# Patient Record
Sex: Male | Born: 1935 | Race: White | Hispanic: No | Marital: Married | Smoking: Former smoker
Health system: Southern US, Community
[De-identification: ages and names within clinical notes are randomized; demographics above are authoritative.]

## PROBLEM LIST (undated history)

## (undated) DIAGNOSIS — Z87442 Personal history of urinary calculi: Secondary | ICD-10-CM

## (undated) DIAGNOSIS — I10 Essential (primary) hypertension: Secondary | ICD-10-CM

## (undated) DIAGNOSIS — E785 Hyperlipidemia, unspecified: Secondary | ICD-10-CM

## (undated) DIAGNOSIS — M109 Gout, unspecified: Secondary | ICD-10-CM

## (undated) DIAGNOSIS — M1990 Unspecified osteoarthritis, unspecified site: Secondary | ICD-10-CM

## (undated) DIAGNOSIS — E119 Type 2 diabetes mellitus without complications: Secondary | ICD-10-CM

## (undated) DIAGNOSIS — Q600 Renal agenesis, unilateral: Secondary | ICD-10-CM

## (undated) DIAGNOSIS — N189 Chronic kidney disease, unspecified: Secondary | ICD-10-CM

## (undated) HISTORY — PX: OTHER SURGICAL HISTORY: SHX169

---

## 2006-05-17 HISTORY — PX: OTHER SURGICAL HISTORY: SHX169

## 2013-02-13 ENCOUNTER — Other Ambulatory Visit: Payer: Self-pay | Admitting: Orthopedic Surgery

## 2013-02-22 ENCOUNTER — Other Ambulatory Visit: Payer: Self-pay | Admitting: Orthopedic Surgery

## 2013-02-22 ENCOUNTER — Encounter (HOSPITAL_COMMUNITY): Payer: Self-pay | Admitting: Pharmacy Technician

## 2013-02-26 ENCOUNTER — Other Ambulatory Visit (HOSPITAL_COMMUNITY): Payer: Self-pay | Admitting: *Deleted

## 2013-02-27 ENCOUNTER — Encounter (HOSPITAL_COMMUNITY)
Admission: RE | Admit: 2013-02-27 | Discharge: 2013-02-27 | Disposition: A | Discharge status: Home / Self Care | Payer: Medicare Other | Source: Ambulatory Visit | Attending: Orthopedic Surgery | Admitting: Orthopedic Surgery

## 2013-02-27 ENCOUNTER — Encounter (HOSPITAL_COMMUNITY): Payer: Self-pay

## 2013-02-27 ENCOUNTER — Ambulatory Visit (HOSPITAL_COMMUNITY)
Admission: RE | Admit: 2013-02-27 | Discharge: 2013-02-27 | Disposition: A | Discharge status: Home / Self Care | Payer: Medicare Other | Source: Ambulatory Visit | Attending: Orthopedic Surgery | Admitting: Orthopedic Surgery

## 2013-02-27 DIAGNOSIS — M5380 Other specified dorsopathies, site unspecified: Secondary | ICD-10-CM | POA: Insufficient documentation

## 2013-02-27 DIAGNOSIS — Z01812 Encounter for preprocedural laboratory examination: Secondary | ICD-10-CM | POA: Insufficient documentation

## 2013-02-27 DIAGNOSIS — Z01818 Encounter for other preprocedural examination: Secondary | ICD-10-CM | POA: Insufficient documentation

## 2013-02-27 DIAGNOSIS — M1710 Unilateral primary osteoarthritis, unspecified knee: Secondary | ICD-10-CM | POA: Insufficient documentation

## 2013-02-27 DIAGNOSIS — I10 Essential (primary) hypertension: Secondary | ICD-10-CM | POA: Insufficient documentation

## 2013-02-27 HISTORY — DX: Chronic kidney disease, unspecified: N18.9

## 2013-02-27 HISTORY — DX: Gout, unspecified: M10.9

## 2013-02-27 HISTORY — DX: Type 2 diabetes mellitus without complications: E11.9

## 2013-02-27 HISTORY — DX: Essential (primary) hypertension: I10

## 2013-02-27 LAB — CBC
Hemoglobin: 16.2 g/dL (ref 13.0–17.0)
MCH: 32.3 pg (ref 26.0–34.0)
Platelets: 113 10*3/uL — ABNORMAL LOW (ref 150–400)
RBC: 5.02 MIL/uL (ref 4.22–5.81)
WBC: 6.6 10*3/uL (ref 4.0–10.5)

## 2013-02-27 LAB — COMPREHENSIVE METABOLIC PANEL
ALT: 21 U/L (ref 0–53)
AST: 24 U/L (ref 0–37)
Albumin: 3.9 g/dL (ref 3.5–5.2)
Alkaline Phosphatase: 71 U/L (ref 39–117)
CO2: 28 mEq/L (ref 19–32)
Calcium: 10.1 mg/dL (ref 8.4–10.5)
GFR calc Af Amer: 64 mL/min — ABNORMAL LOW (ref >90)
GFR calc non Af Amer: 55 mL/min — ABNORMAL LOW (ref >90)
Glucose, Bld: 106 mg/dL — ABNORMAL HIGH (ref 70–99)
Potassium: 4.4 mEq/L (ref 3.5–5.1)
Sodium: 138 mEq/L (ref 135–145)

## 2013-02-27 LAB — URINALYSIS, ROUTINE W REFLEX MICROSCOPIC
Glucose, UA: NEGATIVE mg/dL
Ketones, ur: NEGATIVE mg/dL
Nitrite: NEGATIVE
Protein, ur: NEGATIVE mg/dL
Specific Gravity, Urine: 1.016 (ref 1.005–1.030)
Urobilinogen, UA: 0.2 mg/dL (ref 0.0–1.0)

## 2013-02-27 LAB — SURGICAL PCR SCREEN
MRSA, PCR: NEGATIVE
Staphylococcus aureus: NEGATIVE

## 2013-02-27 LAB — PROTIME-INR: INR: 0.96 (ref 0.00–1.49)

## 2013-02-27 LAB — ABO/RH: ABO/RH(D): O POS

## 2013-02-27 LAB — APTT: aPTT: 30 seconds (ref 24–37)

## 2013-02-27 NOTE — Progress Notes (Signed)
Cbc and ua, micro results faxed to dr Lequita Halt by epic

## 2013-02-27 NOTE — Patient Instructions (Addendum)
[facility]  02/27/2013   Your procedure is scheduled on: 03-05-2013  Report to [facility] at 625 AM.  Call this number if you have problems the morning of surgery [phone]   Remember:   Do not eat food or drink liquids :After Midnight.     Take these medicines the morning of surgery with A SIP OF WATER: no meds to take                                SEE [facility] PREPARING FOR SURGERY SHEET             You may not have any metal on your body including hair pins and piercings  Do not wear jewelry, make-up.  Do not wear lotions, powders, or perfumes. You may wear deodorant.   Men may shave face and neck.  Do not bring valuables to the hospital. [facility] IS NOT RESPONSIBLE FOR VALUEABLES.  Contacts, dentures or bridgework may not be worn into surgery.  Leave suitcase in the car. After surgery it may be brought to your room.  For patients admitted to the hospital, checkout time is 11:00 AM the day of discharge.   Patients discharged the day of surgery will not be allowed to drive home.  Name and phone number of your driver:  Special Instructions: N/A   Please read over the following fact sheets that you were given: mrsa information, blood fact sheet, incentive spirometer sheet  Call Cain Sieve RN pre op nurse if needed [phone]    FAILURE TO FOLLOW THESE INSTRUCTIONS MAY RESULT IN THE CANCELLATION OF YOUR SURGERY.  PATIENT SIGNATURE___________________________________________  NURSE SIGNATURE_____________________________________________

## 2013-02-28 NOTE — Progress Notes (Signed)
Fax received and placed on pt chart cipro 500 mg bid x 3 days called in for pt by drew perkins pa, and aware pt platlet count 113.

## 2013-03-04 ENCOUNTER — Other Ambulatory Visit: Payer: Self-pay | Admitting: Orthopedic Surgery

## 2013-03-04 NOTE — H&P (Signed)
[facility]  DOB: 11/28/1933 Married / Language: English / Race: White Male  Date of Admission:  03/05/2013  Chief Complaint:  Left Knee Pain  History of Present Illness The patient is a 78 year old male who comes in for a preoperative History and Physical. The patient is scheduled for a left total knee arthroplasty to be performed by Dr. Frank V. Aluisio, MD at [facility] on 03/05/2013. The patient is a 78 year old male who presents today for follow up of their knee. The patient is being followed for their bilateral knee pain and osteoarthritis. They are now 6 month(s) out from last evaluation. Symptoms reported today include: pain (especially with stairs in anterior knees), while the patient does not report symptoms of: swelling or instability. Current treatment includes: bracing (custom OA brace on the left). [facility] comes back in today for his knees accompanied by his daughter, [facility]. He states that knees continue to cause problems. He was evaluated six months ago for bilateral knee pain, left wores than right. Based on recommendations, he started water aerobics. He states that one of the biggest issues is going up and down steps. He has been working with a personal trainer and concentrating on strengthening his quad muscles. He has reached a point that he would like to go ahead and pursue knee replacement surgery. They have been treated conservatively in the past for the above stated problem and despite conservative measures, they continue to have progressive pain and severe functional limitations and dysfunction. They have failed non-operative management including home exercise, medications, and injections. It is felt that they would benefit from undergoing total joint replacement. Risks and benefits of the procedure have been discussed with the patient and they elect to proceed with surgery. There are no active contraindications to surgery such as ongoing  infection or rapidly progressive neurological disease.   Problem List Primary osteoarthritis of both knees (715.16)    Allergies Sulfa Drugs. Dizziness. Penicillins    Family History Kidney disease. grandmother fathers side and child Osteoarthritis. mother, sister and brother Father. Dementia    Social History Illicit drug use. no Living situation. live with spouse Marital status. married Drug/Alcohol Rehab (Currently). no Drug/Alcohol Rehab (Previously). no Exercise. Exercises daily; does gym / weights Pain Contract. no Tobacco / smoke exposure. no Tobacco use. former smoker; smoke(d) 1 pack(s) per day Alcohol use. current drinker; drinks beer, wine and hard liquor; only occasionally per week Children. 5 or more Current work status. retired Post-Surgical Plans. Plan is to go to [facility]    Medication History Aspirin (81MG Tablet, 1 (one) Oral) Active. Vitamin D ( Oral) Specific dose unknown - Active. Finasteride (5MG Tablet, Oral) Active. Lisinopril (10MG Tablet, Oral) Active. Simvastatin (10MG Tablet, Oral) Active. Vitamin D ( Oral) Specific dose unknown - Active. Vitamin B12 ( Oral) Specific dose unknown - Active.    Past Surgical History Arthroscopy of Knee. right Cataract Surgery. bilateral Colon Polyp Removal - Colonoscopy   Medical Histroy Diabetes Mellitus, Type II Gout High blood pressure Kidney Stone Shingles Cataract Varicose veins Hiatal Hernia Hemorrhoids Scarlet Fever Measles Mumps Solitary Kidney    Review of Systems General:Present- Night Sweats. Not Present- Chills, Fever, Fatigue, Weight Gain, Weight Loss and Memory Loss. Skin:Not Present- Hives, Itching, Rash, Eczema and Lesions. HEENT:Not Present- Tinnitus, Headache, Double Vision, Visual Loss, Hearing Loss and Dentures. Respiratory:Not Present- Shortness of breath with exertion, Shortness of breath at rest, Allergies, Coughing up  blood and Chronic Cough. Cardiovascular:Not Present-   Chest Pain, Racing/skipping heartbeats, Difficulty Breathing Lying Down, Murmur, Swelling and Palpitations. Gastrointestinal:Not Present- Bloody Stool, Heartburn, Abdominal Pain, Vomiting, Nausea, Constipation, Diarrhea, Difficulty Swallowing, Jaundice and Loss of appetitie. Male Genitourinary:Not Present- Urinary frequency, Blood in Urine, Weak urinary stream, Discharge, Flank Pain, Incontinence, Painful Urination, Urgency, Urinary Retention and Urinating at Night. Musculoskeletal:Present- Joint Pain. Not Present- Muscle Weakness, Muscle Pain, Joint Swelling, Back Pain, Morning Stiffness and Spasms. Neurological:Not Present- Tremor, Dizziness, Blackout spells, Paralysis, Difficulty with balance and Weakness. Psychiatric:Not Present- Insomnia.    Vitals Weight: 184 lb Height: 68 in Height was reported by patient. Body Surface Area: 2 m Body Mass Index: 27.98 kg/m Pulse: 64 (Regular) Resp.: 14 (Unlabored) BP: 128/70 (Sitting, Left Arm, Standard)     Physical Exam The physical exam findings are as follows:   General Mental Status - Alert, cooperative and good historian. General Appearance- pleasant. Not in acute distress. Orientation- Oriented X3. Build & Nutrition- Well nourished and Well developed.   Head and Neck Head- normocephalic, atraumatic . Neck Global Assessment- supple. no bruit auscultated on the right and no bruit auscultated on the left.   Eye Vision- Wears corrective lenses. Pupil- Bilateral- Regular and Round. Motion- Bilateral- EOMI.   Chest and Lung Exam Auscultation: Breath sounds:- clear at anterior chest wall and - clear at posterior chest wall. Adventitious sounds:- No Adventitious sounds.   Cardiovascular Auscultation:Rhythm- Regular rate and rhythm. Heart Sounds- S1 WNL and S2 WNL. Murmurs & Other Heart Sounds:Auscultation of the heart reveals - No  Murmurs.   Abdomen Palpation/Percussion:Tenderness- Abdomen is non-tender to palpation. Rigidity (guarding)- Abdomen is soft. Auscultation:Auscultation of the abdomen reveals - Bowel sounds normal.   Male Genitourinary Not done, not pertinent to present illness  Musculoskeletal  Well developed male alert and oriented in no apparent distress. The left knee no effusion. Range of motion is 5 to 125. He has a varus deformity. There is marked crepitus on range of motion. He is tender medial greater than lateral with no instability noted. Right knee no effusion. Marked crepitus on range of motion. Range 5 to 130. No tenderness noted, no instability.  RADIOGRAPHS: Radiographs taken today show that AP both knees and lateral of the left there is significant bone on bone arthritis in the medial and patellofemoral compartments of the left knee. Right knee is tricompartmental with large osteophytes.   Assessment & Plan Primary osteoarthritis of both knees (715.16) Impression: Left Knee  Note: Plan is for a Left Total Knee Replacement by Dr. Aluisio.  Plan is to go to [facility].  PCP - Dr. Patel  The patient does not have any contraindications and will receive TXA (tranexamic acid) prior to surgery.  Signed electronically by Chasey Dull L Qunicy Higinbotham, III PA-C 

## 2013-03-05 ENCOUNTER — Encounter (HOSPITAL_COMMUNITY)
Admission: RE | Disposition: A | Discharge status: Skilled Nursing Facility | Payer: Self-pay | Source: Ambulatory Visit | Attending: Orthopedic Surgery

## 2013-03-05 ENCOUNTER — Inpatient Hospital Stay (HOSPITAL_COMMUNITY): Payer: Medicare Other | Admitting: Registered Nurse

## 2013-03-05 ENCOUNTER — Encounter (HOSPITAL_COMMUNITY): Payer: Self-pay | Admitting: *Deleted

## 2013-03-05 ENCOUNTER — Encounter (HOSPITAL_COMMUNITY): Payer: Medicare Other | Admitting: Registered Nurse

## 2013-03-05 ENCOUNTER — Inpatient Hospital Stay (HOSPITAL_COMMUNITY)
Admission: RE | Admit: 2013-03-05 | Discharge: 2013-03-08 | DRG: 470 | Disposition: A | Discharge status: Skilled Nursing Facility | Payer: Medicare Other | Source: Ambulatory Visit | Attending: Orthopedic Surgery | Admitting: Orthopedic Surgery

## 2013-03-05 DIAGNOSIS — Q602 Renal agenesis, unspecified: Secondary | ICD-10-CM | POA: Diagnosis present

## 2013-03-05 DIAGNOSIS — Z79899 Other long term (current) drug therapy: Secondary | ICD-10-CM

## 2013-03-05 DIAGNOSIS — I129 Hypertensive chronic kidney disease with stage 1 through stage 4 chronic kidney disease, or unspecified chronic kidney disease: Secondary | ICD-10-CM | POA: Diagnosis present

## 2013-03-05 DIAGNOSIS — E1129 Type 2 diabetes mellitus with other diabetic kidney complication: Secondary | ICD-10-CM | POA: Diagnosis present

## 2013-03-05 DIAGNOSIS — Z87442 Personal history of urinary calculi: Secondary | ICD-10-CM

## 2013-03-05 DIAGNOSIS — M179 Osteoarthritis of knee, unspecified: Secondary | ICD-10-CM

## 2013-03-05 DIAGNOSIS — M1710 Unilateral primary osteoarthritis, unspecified knee: Principal | ICD-10-CM | POA: Diagnosis present

## 2013-03-05 DIAGNOSIS — M109 Gout, unspecified: Secondary | ICD-10-CM | POA: Diagnosis present

## 2013-03-05 DIAGNOSIS — Z87891 Personal history of nicotine dependence: Secondary | ICD-10-CM

## 2013-03-05 DIAGNOSIS — Z880 Allergy status to penicillin: Secondary | ICD-10-CM

## 2013-03-05 DIAGNOSIS — N183 Chronic kidney disease, stage 3 unspecified: Secondary | ICD-10-CM | POA: Diagnosis present

## 2013-03-05 DIAGNOSIS — Z96652 Presence of left artificial knee joint: Secondary | ICD-10-CM

## 2013-03-05 HISTORY — PX: TOTAL KNEE ARTHROPLASTY: SHX125

## 2013-03-05 LAB — TYPE AND SCREEN
ABO/RH(D): O POS
Antibody Screen: NEGATIVE

## 2013-03-05 LAB — GLUCOSE, CAPILLARY: Glucose-Capillary: 120 mg/dL — ABNORMAL HIGH (ref 70–99)

## 2013-03-05 SURGERY — ARTHROPLASTY, KNEE, TOTAL
Anesthesia: Spinal | Site: Knee | Laterality: Left | Wound class: Clean

## 2013-03-05 MED ORDER — MENTHOL 3 MG MT LOZG: 1.0000 | LOZENGE | OROMUCOSAL | Status: DC | PRN

## 2013-03-05 MED ORDER — BUPIVACAINE LIPOSOME 1.3 % IJ SUSP
20.0000 mL | Freq: Once | INTRAMUSCULAR | Status: DC
  Filled 2013-03-05: qty 20

## 2013-03-05 MED ORDER — HYDROMORPHONE HCL PF 1 MG/ML IJ SOLN: 0.2500 mg | INTRAMUSCULAR | Status: DC | PRN

## 2013-03-05 MED ORDER — FINASTERIDE 5 MG PO TABS
5.0000 mg | ORAL_TABLET | Freq: Every day | ORAL | Status: DC
  Administered 2013-03-05 – 2013-03-08 (×4): 5 mg via ORAL
  Filled 2013-03-05 (×4): qty 1

## 2013-03-05 MED ORDER — ONDANSETRON HCL 4 MG/2ML IJ SOLN
INTRAMUSCULAR | Status: DC | PRN
  Administered 2013-03-05: 4 mg via INTRAMUSCULAR

## 2013-03-05 MED ORDER — PROMETHAZINE HCL 25 MG/ML IJ SOLN: 6.2500 mg | INTRAMUSCULAR | Status: DC | PRN

## 2013-03-05 MED ORDER — FLEET ENEMA 7-19 GM/118ML RE ENEM
1.0000 | ENEMA | Freq: Once | RECTAL | Status: AC | PRN
  Filled 2013-03-05: qty 1

## 2013-03-05 MED ORDER — DEXAMETHASONE 6 MG PO TABS
10.0000 mg | ORAL_TABLET | Freq: Every day | ORAL | Status: AC
  Filled 2013-03-05: qty 1

## 2013-03-05 MED ORDER — OXYCODONE HCL 5 MG PO TABS
5.0000 mg | ORAL_TABLET | ORAL | Status: DC | PRN
  Administered 2013-03-05: 16:00:00 5 mg via ORAL
  Administered 2013-03-05 – 2013-03-08 (×15): 10 mg via ORAL
  Filled 2013-03-05 (×15): qty 2
  Filled 2013-03-05: qty 1

## 2013-03-05 MED ORDER — CEFAZOLIN SODIUM-DEXTROSE 2-3 GM-% IV SOLR
2.0000 g | INTRAVENOUS | Status: AC
  Administered 2013-03-05: 2 g via INTRAVENOUS

## 2013-03-05 MED ORDER — OXYCODONE HCL 5 MG/5ML PO SOLN
5.0000 mg | Freq: Once | ORAL | Status: DC | PRN
  Filled 2013-03-05: qty 5

## 2013-03-05 MED ORDER — LACTATED RINGERS IV SOLN
INTRAVENOUS | Status: DC | PRN
  Administered 2013-03-05: 09:00:00 via INTRAVENOUS

## 2013-03-05 MED ORDER — RIVAROXABAN 10 MG PO TABS
10.0000 mg | ORAL_TABLET | Freq: Every day | ORAL | Status: DC
  Administered 2013-03-06 – 2013-03-08 (×3): 10 mg via ORAL
  Filled 2013-03-05 (×4): qty 1

## 2013-03-05 MED ORDER — SODIUM CHLORIDE 0.9 % IV SOLN: INTRAVENOUS | Status: DC

## 2013-03-05 MED ORDER — METHOCARBAMOL 100 MG/ML IJ SOLN
500.0000 mg | Freq: Four times a day (QID) | INTRAMUSCULAR | Status: DC | PRN
  Administered 2013-03-05: 14:00:00 500 mg via INTRAVENOUS
  Filled 2013-03-05: qty 5

## 2013-03-05 MED ORDER — TRANEXAMIC ACID 100 MG/ML IV SOLN
1000.0000 mg | INTRAVENOUS | Status: AC
  Administered 2013-03-05: 1000 mg via INTRAVENOUS
  Filled 2013-03-05: qty 10

## 2013-03-05 MED ORDER — BUPIVACAINE IN DEXTROSE 0.75-8.25 % IT SOLN
INTRATHECAL | Status: DC | PRN
  Administered 2013-03-05: 2 mL via INTRATHECAL

## 2013-03-05 MED ORDER — BUPIVACAINE HCL 0.25 % IJ SOLN
INTRAMUSCULAR | Status: DC | PRN
  Administered 2013-03-05: 30 mL

## 2013-03-05 MED ORDER — SODIUM CHLORIDE 0.9 % IJ SOLN
INTRAMUSCULAR | Status: DC | PRN
  Administered 2013-03-05: 30 mL via INTRAVENOUS

## 2013-03-05 MED ORDER — CEFAZOLIN SODIUM-DEXTROSE 2-3 GM-% IV SOLR
2.0000 g | Freq: Four times a day (QID) | INTRAVENOUS | Status: AC
  Administered 2013-03-05 – 2013-03-06 (×2): 2 g via INTRAVENOUS
  Filled 2013-03-05 (×2): qty 50

## 2013-03-05 MED ORDER — BUPIVACAINE HCL (PF) 0.25 % IJ SOLN
INTRAMUSCULAR | Status: AC
  Filled 2013-03-05: qty 30

## 2013-03-05 MED ORDER — DOCUSATE SODIUM 100 MG PO CAPS
100.0000 mg | ORAL_CAPSULE | Freq: Two times a day (BID) | ORAL | Status: DC
  Administered 2013-03-05 – 2013-03-08 (×6): 100 mg via ORAL

## 2013-03-05 MED ORDER — METOCLOPRAMIDE HCL 5 MG PO TABS
5.0000 mg | ORAL_TABLET | Freq: Three times a day (TID) | ORAL | Status: DC | PRN
  Filled 2013-03-05: qty 2

## 2013-03-05 MED ORDER — ACETAMINOPHEN 325 MG PO TABS: 650.0000 mg | ORAL_TABLET | Freq: Four times a day (QID) | ORAL | Status: DC | PRN

## 2013-03-05 MED ORDER — PROPOFOL INFUSION 10 MG/ML OPTIME
INTRAVENOUS | Status: DC | PRN
  Administered 2013-03-05: 75 ug/kg/min via INTRAVENOUS

## 2013-03-05 MED ORDER — DEXAMETHASONE SODIUM PHOSPHATE 10 MG/ML IJ SOLN
10.0000 mg | Freq: Every day | INTRAMUSCULAR | Status: AC
  Administered 2013-03-06: 10 mg via INTRAVENOUS
  Filled 2013-03-05: qty 1

## 2013-03-05 MED ORDER — EPHEDRINE SULFATE 50 MG/ML IJ SOLN
INTRAMUSCULAR | Status: DC | PRN
  Administered 2013-03-05 (×2): 5 mg via INTRAVENOUS

## 2013-03-05 MED ORDER — BISACODYL 10 MG RE SUPP
10.0000 mg | Freq: Every day | RECTAL | Status: DC | PRN
  Filled 2013-03-05: qty 1

## 2013-03-05 MED ORDER — METOCLOPRAMIDE HCL 5 MG/ML IJ SOLN: 5.0000 mg | Freq: Three times a day (TID) | INTRAMUSCULAR | Status: DC | PRN

## 2013-03-05 MED ORDER — PHENOL 1.4 % MT LIQD: 1.0000 | OROMUCOSAL | Status: DC | PRN

## 2013-03-05 MED ORDER — SODIUM CHLORIDE 0.9 % IR SOLN
Status: DC | PRN
  Administered 2013-03-05: 1000 mL

## 2013-03-05 MED ORDER — CEFAZOLIN SODIUM-DEXTROSE 2-3 GM-% IV SOLR
INTRAVENOUS | Status: AC
  Filled 2013-03-05: qty 50

## 2013-03-05 MED ORDER — OXYCODONE HCL 5 MG PO TABS: 5.0000 mg | ORAL_TABLET | Freq: Once | ORAL | Status: DC | PRN

## 2013-03-05 MED ORDER — FENTANYL CITRATE 0.05 MG/ML IJ SOLN
INTRAMUSCULAR | Status: DC | PRN
  Administered 2013-03-05: 25 ug via INTRAVENOUS

## 2013-03-05 MED ORDER — LIDOCAINE HCL (CARDIAC) 20 MG/ML IV SOLN
INTRAVENOUS | Status: DC | PRN
  Administered 2013-03-05: 100 mg via INTRAVENOUS

## 2013-03-05 MED ORDER — ONDANSETRON HCL 4 MG/2ML IJ SOLN: 4.0000 mg | Freq: Four times a day (QID) | INTRAMUSCULAR | Status: DC | PRN

## 2013-03-05 MED ORDER — DIPHENHYDRAMINE HCL 12.5 MG/5ML PO ELIX
12.5000 mg | ORAL_SOLUTION | ORAL | Status: DC | PRN
  Filled 2013-03-05: qty 10

## 2013-03-05 MED ORDER — TRAMADOL HCL 50 MG PO TABS
50.0000 mg | ORAL_TABLET | Freq: Four times a day (QID) | ORAL | Status: DC | PRN
  Administered 2013-03-07: 100 mg via ORAL
  Filled 2013-03-05 (×2): qty 2

## 2013-03-05 MED ORDER — SODIUM CHLORIDE 0.9 % IR SOLN
Status: DC | PRN
  Administered 2013-03-05: 200 mL

## 2013-03-05 MED ORDER — ACETAMINOPHEN 650 MG RE SUPP
650.0000 mg | Freq: Four times a day (QID) | RECTAL | Status: DC | PRN
  Filled 2013-03-05: qty 1

## 2013-03-05 MED ORDER — KETOROLAC TROMETHAMINE 15 MG/ML IJ SOLN: 7.5000 mg | Freq: Four times a day (QID) | INTRAMUSCULAR | Status: AC | PRN

## 2013-03-05 MED ORDER — METHOCARBAMOL 500 MG PO TABS
500.0000 mg | ORAL_TABLET | Freq: Four times a day (QID) | ORAL | Status: DC | PRN
  Administered 2013-03-06 – 2013-03-08 (×7): 500 mg via ORAL
  Filled 2013-03-05 (×7): qty 1

## 2013-03-05 MED ORDER — ONDANSETRON HCL 4 MG PO TABS
4.0000 mg | ORAL_TABLET | Freq: Four times a day (QID) | ORAL | Status: DC | PRN
  Filled 2013-03-05: qty 1

## 2013-03-05 MED ORDER — ACETAMINOPHEN 500 MG PO TABS
1000.0000 mg | ORAL_TABLET | Freq: Once | ORAL | Status: AC
  Administered 2013-03-05: 1000 mg via ORAL
  Filled 2013-03-05: qty 2

## 2013-03-05 MED ORDER — MEPERIDINE HCL 50 MG/ML IJ SOLN: 6.2500 mg | INTRAMUSCULAR | Status: DC | PRN

## 2013-03-05 MED ORDER — SODIUM CHLORIDE 0.9 % IJ SOLN
INTRAMUSCULAR | Status: AC
  Filled 2013-03-05: qty 50

## 2013-03-05 MED ORDER — DEXAMETHASONE SODIUM PHOSPHATE 10 MG/ML IJ SOLN
10.0000 mg | Freq: Once | INTRAMUSCULAR | Status: AC
  Administered 2013-03-05: 10 mg via INTRAVENOUS

## 2013-03-05 MED ORDER — MIDAZOLAM HCL 5 MG/5ML IJ SOLN
INTRAMUSCULAR | Status: DC | PRN
  Administered 2013-03-05 (×2): 1 mg via INTRAVENOUS

## 2013-03-05 MED ORDER — MORPHINE SULFATE 10 MG/ML IJ SOLN: 1.0000 mg | INTRAMUSCULAR | Status: DC | PRN

## 2013-03-05 MED ORDER — SIMVASTATIN 10 MG PO TABS
10.0000 mg | ORAL_TABLET | Freq: Every day | ORAL | Status: DC
  Administered 2013-03-05 – 2013-03-07 (×3): 10 mg via ORAL
  Filled 2013-03-05 (×4): qty 1

## 2013-03-05 MED ORDER — SODIUM CHLORIDE 0.9 % IV SOLN
INTRAVENOUS | Status: DC
  Administered 2013-03-05 – 2013-03-06 (×2): via INTRAVENOUS

## 2013-03-05 MED ORDER — BUPIVACAINE LIPOSOME 1.3 % IJ SUSP
INTRAMUSCULAR | Status: DC | PRN
  Administered 2013-03-05: 20 mL

## 2013-03-05 MED ORDER — POLYETHYLENE GLYCOL 3350 17 G PO PACK
17.0000 g | PACK | Freq: Every day | ORAL | Status: DC | PRN
  Filled 2013-03-05: qty 1

## 2013-03-05 MED ORDER — ACETAMINOPHEN 500 MG PO TABS
1000.0000 mg | ORAL_TABLET | Freq: Four times a day (QID) | ORAL | Status: AC
  Administered 2013-03-05 – 2013-03-06 (×3): 1000 mg via ORAL
  Filled 2013-03-05 (×7): qty 2

## 2013-03-05 SURGICAL SUPPLY — 57 items
BAG ZIPLOCK 12X15 (MISCELLANEOUS) ×2 IMPLANT
BANDAGE ELASTIC 6 VELCRO ST LF (GAUZE/BANDAGES/DRESSINGS) ×2 IMPLANT
BANDAGE ESMARK 6X9 LF (GAUZE/BANDAGES/DRESSINGS) ×1 IMPLANT
BLADE SAG 18X100X1.27 (BLADE) ×2 IMPLANT
BLADE SAW SGTL 11.0X1.19X90.0M (BLADE) ×2 IMPLANT
BNDG ESMARK 6X9 LF (GAUZE/BANDAGES/DRESSINGS) ×2
BOWL SMART MIX CTS (DISPOSABLE) ×2 IMPLANT
CAPT RP KNEE ×2 IMPLANT
CEMENT HV SMART SET (Cement) ×4 IMPLANT
CLOTH BEACON ORANGE TIMEOUT ST (SAFETY) IMPLANT
CUFF TOURN SGL QUICK 34 (TOURNIQUET CUFF) ×1
CUFF TRNQT CYL 34X4X40X1 (TOURNIQUET CUFF) ×1 IMPLANT
DECANTER SPIKE VIAL GLASS SM (MISCELLANEOUS) ×2 IMPLANT
DRAPE EXTREMITY T 121X128X90 (DRAPE) ×2 IMPLANT
DRAPE POUCH INSTRU U-SHP 10X18 (DRAPES) ×2 IMPLANT
DRAPE U-SHAPE 47X51 STRL (DRAPES) ×2 IMPLANT
DRSG ADAPTIC 3X8 NADH LF (GAUZE/BANDAGES/DRESSINGS) ×2 IMPLANT
DRSG PAD ABDOMINAL 8X10 ST (GAUZE/BANDAGES/DRESSINGS) ×2 IMPLANT
DURAPREP 26ML APPLICATOR (WOUND CARE) ×2 IMPLANT
ELECT REM PT RETURN 9FT ADLT (ELECTROSURGICAL) ×2
ELECTRODE REM PT RTRN 9FT ADLT (ELECTROSURGICAL) ×1 IMPLANT
EVACUATOR 1/8 PVC DRAIN (DRAIN) ×2 IMPLANT
FACESHIELD LNG OPTICON STERILE (SAFETY) ×10 IMPLANT
GLOVE BIO SURGEON STRL SZ7.5 (GLOVE) IMPLANT
GLOVE BIO SURGEON STRL SZ8 (GLOVE) ×2 IMPLANT
GLOVE BIOGEL PI IND STRL 8 (GLOVE) ×2 IMPLANT
GLOVE BIOGEL PI INDICATOR 8 (GLOVE) ×2
GLOVE SURG SS PI 6.5 STRL IVOR (GLOVE) IMPLANT
GOWN PREVENTION PLUS LG XLONG (DISPOSABLE) ×6 IMPLANT
GOWN STRL REIN XL XLG (GOWN DISPOSABLE) IMPLANT
HANDPIECE INTERPULSE COAX TIP (DISPOSABLE) ×1
IMMOBILIZER KNEE 20 (SOFTGOODS) ×2
IMMOBILIZER KNEE 20 THIGH 36 (SOFTGOODS) ×1 IMPLANT
KIT BASIN OR (CUSTOM PROCEDURE TRAY) ×2 IMPLANT
MANIFOLD NEPTUNE II (INSTRUMENTS) ×2 IMPLANT
NDL SAFETY ECLIPSE 18X1.5 (NEEDLE) ×2 IMPLANT
NEEDLE HYPO 18GX1.5 SHARP (NEEDLE) ×2
NS IRRIG 1000ML POUR BTL (IV SOLUTION) ×2 IMPLANT
PACK TOTAL JOINT (CUSTOM PROCEDURE TRAY) ×2 IMPLANT
PADDING CAST ABS 6INX4YD NS (CAST SUPPLIES) ×2
PADDING CAST ABS COTTON 6X4 NS (CAST SUPPLIES) ×2 IMPLANT
PADDING CAST COTTON 6X4 STRL (CAST SUPPLIES) ×6 IMPLANT
POSITIONER SURGICAL ARM (MISCELLANEOUS) ×2 IMPLANT
SET HNDPC FAN SPRY TIP SCT (DISPOSABLE) ×1 IMPLANT
SPONGE GAUZE 4X4 12PLY (GAUZE/BANDAGES/DRESSINGS) ×2 IMPLANT
STRIP CLOSURE SKIN 1/2X4 (GAUZE/BANDAGES/DRESSINGS) ×2 IMPLANT
SUCTION FRAZIER 12FR DISP (SUCTIONS) ×2 IMPLANT
SUT MNCRL AB 4-0 PS2 18 (SUTURE) ×2 IMPLANT
SUT VIC AB 2-0 CT1 27 (SUTURE) ×4
SUT VIC AB 2-0 CT1 TAPERPNT 27 (SUTURE) ×4 IMPLANT
SUT VLOC 180 0 24IN GS25 (SUTURE) ×2 IMPLANT
SYR 20CC LL (SYRINGE) ×2 IMPLANT
SYR 50ML LL SCALE MARK (SYRINGE) ×2 IMPLANT
TOWEL OR 17X26 10 PK STRL BLUE (TOWEL DISPOSABLE) ×4 IMPLANT
TRAY FOLEY CATH 14FRSI W/METER (CATHETERS) ×2 IMPLANT
WATER STERILE IRR 1500ML POUR (IV SOLUTION) ×4 IMPLANT
WRAP KNEE MAXI GEL POST OP (GAUZE/BANDAGES/DRESSINGS) ×2 IMPLANT

## 2013-03-05 NOTE — Preoperative (Signed)
Beta Blockers   Reason not to administer Beta Blockers:Not Applicable 

## 2013-03-05 NOTE — Anesthesia Preprocedure Evaluation (Addendum)
Anesthesia Evaluation  Patient identified by MRN, date of birth, ID band Patient awake    Reviewed: Allergy & Precautions, H&P , NPO status , Patient's Chart, lab work & pertinent test results  Airway       Dental  (+) Dental Advisory Given   Pulmonary neg pulmonary ROS,          Cardiovascular hypertension, Pt. on medications     Neuro/Psych negative neurological ROS  negative psych ROS   GI/Hepatic negative GI ROS, Neg liver ROS,   Endo/Other  diabetes, Type 2  Renal/GU Renal disease     Musculoskeletal negative musculoskeletal ROS (+)   Abdominal   Peds  Hematology negative hematology ROS (+)   Anesthesia Other Findings   Reproductive/Obstetrics negative OB ROS                         Anesthesia Physical Anesthesia Plan  ASA: III  Anesthesia Plan: Spinal   Post-op Pain Management:    Induction: Intravenous  Airway Management Planned:   Additional Equipment:   Intra-op Plan:   Post-operative Plan:   Informed Consent: I have reviewed the patients History and Physical, chart, labs and discussed the procedure including the risks, benefits and alternatives for the proposed anesthesia with the patient or authorized representative who has indicated his/her understanding and acceptance.   Dental advisory given  Plan Discussed with: CRNA  Anesthesia Plan Comments:         Anesthesia Quick Evaluation

## 2013-03-05 NOTE — H&P (View-Only) (Signed)
[facility]  DOB: March 12, 1934 Married / Language: English / Race: White Male  Date of Admission:  03/05/2013  Chief Complaint:  Left Knee Pain  History of Present Illness The patient is a 79 year old male who comes in for a preoperative History and Physical. The patient is scheduled for a left total knee arthroplasty to be performed by Dr. [facility]. Aluisio, MD at [facility] on 03/05/2013. The patient is a 79 year old male who presents today for follow up of their knee. The patient is being followed for their bilateral knee pain and osteoarthritis. They are now 6 month(s) out from last evaluation. Symptoms reported today include: pain (especially with stairs in anterior knees), while the patient does not report symptoms of: swelling or instability. Current treatment includes: bracing (custom OA brace on the left). [facility] comes back in today for his knees accompanied by his daughter, [facility]. He states that knees continue to cause problems. He was evaluated six months ago for bilateral knee pain, left wores than right. Based on recommendations, he started water aerobics. He states that one of the biggest issues is going up and down steps. He has been working with a Systems analyst and concentrating on strengthening his quad muscles. He has reached a point that he would like to go ahead and pursue knee replacement surgery. They have been treated conservatively in the past for the above stated problem and despite conservative measures, they continue to have progressive pain and severe functional limitations and dysfunction. They have failed non-operative management including home exercise, medications, and injections. It is felt that they would benefit from undergoing total joint replacement. Risks and benefits of the procedure have been discussed with the patient and they elect to proceed with surgery. There are no active contraindications to surgery such as ongoing  infection or rapidly progressive neurological disease.   Problem List Primary osteoarthritis of both knees (715.16)    Allergies Sulfa Drugs. Dizziness. Penicillins    Family History Kidney disease. grandmother fathers side and child Osteoarthritis. mother, sister and brother Father. Dementia    Social History Illicit drug use. no Living situation. live with spouse Marital status. married Drug/Alcohol Rehab (Currently). no Drug/Alcohol Rehab (Previously). no Exercise. Exercises daily; does gym / weights Pain Contract. no Tobacco / smoke exposure. no Tobacco use. former smoker; smoke(d) 1 pack(s) per day Alcohol use. current drinker; drinks beer, wine and hard liquor; only occasionally per week Children. 5 or more Current work status. retired Museum/gallery exhibitions officer. Plan is to go to [facility]    Medication History Aspirin (81MG  Tablet, 1 (one) Oral) Active. Vitamin D ( Oral) Specific dose unknown - Active. Finasteride (5MG  Tablet, Oral) Active. Lisinopril (10MG  Tablet, Oral) Active. Simvastatin (10MG  Tablet, Oral) Active. Vitamin D ( Oral) Specific dose unknown - Active. Vitamin B12 ( Oral) Specific dose unknown - Active.    Past Surgical History Arthroscopy of Knee. right Cataract Surgery. bilateral Colon Polyp Removal - Colonoscopy   Medical Histroy Diabetes Mellitus, Type II Gout High blood pressure Kidney Stone Shingles Cataract Varicose veins Hiatal Hernia Hemorrhoids Scarlet Fever Measles Mumps Solitary Kidney    Review of Systems General:Present- Night Sweats. Not Present- Chills, Fever, Fatigue, Weight Gain, Weight Loss and Memory Loss. Skin:Not Present- Hives, Itching, Rash, Eczema and Lesions. HEENT:Not Present- Tinnitus, Headache, Double Vision, Visual Loss, Hearing Loss and Dentures. Respiratory:Not Present- Shortness of breath with exertion, Shortness of breath at rest, Allergies, Coughing up  blood and Chronic Cough. Cardiovascular:Not Present-  Chest Pain, Racing/skipping heartbeats, Difficulty Breathing Lying Down, Murmur, Swelling and Palpitations. Gastrointestinal:Not Present- Bloody Stool, Heartburn, Abdominal Pain, Vomiting, Nausea, Constipation, Diarrhea, Difficulty Swallowing, Jaundice and Loss of appetitie. Male Genitourinary:Not Present- Urinary frequency, Blood in Urine, Weak urinary stream, Discharge, Flank Pain, Incontinence, Painful Urination, Urgency, Urinary Retention and Urinating at Night. Musculoskeletal:Present- Joint Pain. Not Present- Muscle Weakness, Muscle Pain, Joint Swelling, Back Pain, Morning Stiffness and Spasms. Neurological:Not Present- Tremor, Dizziness, Blackout spells, Paralysis, Difficulty with balance and Weakness. Psychiatric:Not Present- Insomnia.    Vitals Weight: 184 lb Height: 68 in Height was reported by patient. Body Surface Area: 2 m Body Mass Index: 27.98 kg/m Pulse: 64 (Regular) Resp.: 14 (Unlabored) BP: 128/70 (Sitting, Left Arm, Standard)     Physical Exam The physical exam findings are as follows:   General Mental Status - Alert, cooperative and good historian. General Appearance- pleasant. Not in acute distress. Orientation- Oriented X3. Build & Nutrition- Well nourished and Well developed.   Head and Neck Head- normocephalic, atraumatic . Neck Global Assessment- supple. no bruit auscultated on the right and no bruit auscultated on the left.   Eye Vision- Wears corrective lenses. Pupil- Bilateral- Regular and Round. Motion- Bilateral- EOMI.   Chest and Lung Exam Auscultation: Breath sounds:- clear at anterior chest wall and - clear at posterior chest wall. Adventitious sounds:- No Adventitious sounds.   Cardiovascular Auscultation:Rhythm- Regular rate and rhythm. Heart Sounds- S1 WNL and S2 WNL. Murmurs & Other Heart Sounds:Auscultation of the heart reveals - No  Murmurs.   Abdomen Palpation/Percussion:Tenderness- Abdomen is non-tender to palpation. Rigidity (guarding)- Abdomen is soft. Auscultation:Auscultation of the abdomen reveals - Bowel sounds normal.   Male Genitourinary Not done, not pertinent to present illness  Musculoskeletal  Well developed male alert and oriented in no apparent distress. The left knee no effusion. Range of motion is 5 to 125. He has a varus deformity. There is marked crepitus on range of motion. He is tender medial greater than lateral with no instability noted. Right knee no effusion. Marked crepitus on range of motion. Range 5 to 130. No tenderness noted, no instability.  RADIOGRAPHS: Radiographs taken today show that AP both knees and lateral of the left there is significant bone on bone arthritis in the medial and patellofemoral compartments of the left knee. Right knee is tricompartmental with large osteophytes.   Assessment & Plan Primary osteoarthritis of both knees (715.16) Impression: Left Knee  Note: Plan is for a Left Total Knee Replacement by Dr. Lequita Halt.  Plan is to go to [facility].  PCP - Dr. Allena Katz  The patient does not have any contraindications and will receive TXA (tranexamic acid) prior to surgery.  Signed electronically by Lauraine Rinne, III PA-C

## 2013-03-05 NOTE — Anesthesia Procedure Notes (Addendum)
Spinal  Patient location during procedure: OR Start time: 03/05/2013 9:24 AM End time: 03/05/2013 9:27 AM Staffing Anesthesiologist: Lewie Loron R Performed by: anesthesiologist  Preanesthetic Checklist Completed: patient identified, site marked, surgical consent, pre-op evaluation, timeout performed, IV checked, risks and benefits discussed and monitors and equipment checked Spinal Block Patient position: sitting Prep: ChloraPrep Patient monitoring: heart rate, continuous pulse ox and blood pressure Approach: right paramedian Location: L3-4 Injection technique: single-shot Needle Needle type: Quincke  Needle gauge: 22 G Needle length: 9 cm Assessment Sensory level: T8 Additional Notes Expiration date of kit checked and confirmed. Patient tolerated procedure well, without complications.

## 2013-03-05 NOTE — Anesthesia Postprocedure Evaluation (Signed)
Anesthesia Post Note  Patient: [facility]  Procedure(s) Performed: Procedure(s) (LRB): LEFT TOTAL KNEE ARTHROPLASTY (Left)  Anesthesia type: Spinal  Patient location: PACU  Post pain: Pain level controlled  Post assessment: Post-op Vital signs reviewed  Last Vitals: BP 117/60  Pulse 65  Temp(Src) 36.1 C (Oral)  Resp 16  Ht 5' 8.11" (1.73 m)  Wt 185 lb 10 oz (84.2 kg)  BMI 28.13 kg/m2  SpO2 100%  Post vital signs: Reviewed  Level of consciousness: sedated  Complications: No apparent anesthesia complications

## 2013-03-05 NOTE — Interval H&P Note (Signed)
History and Physical Interval Note:  03/05/2013 7:14 AM  [facility]  has presented today for surgery, with the diagnosis of OA OF LEFT KNEE  The various methods of treatment have been discussed with the patient and family. After consideration of risks, benefits and other options for treatment, the patient has consented to  Procedure(s): LEFT TOTAL KNEE ARTHROPLASTY (Left) as a surgical intervention .  The patient's history has been reviewed, patient examined, no change in status, stable for surgery.  I have reviewed the patient's chart and labs.  Questions were answered to the patient's satisfaction.     Loanne Drilling

## 2013-03-05 NOTE — Evaluation (Signed)
Physical Therapy Evaluation Patient Details Name: [facility] MRN: [id number] DOB: 09-07-1933 Today's Date: 03/05/2013 Time: 4098-1191 PT Time Calculation (min): 25 min  PT Assessment / Plan / Recommendation History of Present Illness  s/p L TKA  Clinical Impression  Pt will benefit from PT to address deficits below; He is planning for SNF rehab at [facility] but also states he is thinking about going home; His wife works about 3hrs  per day and is available  As needed other than that.    PT Assessment  Patient needs continued PT services    Follow Up Recommendations  Home health PT    Does the patient have the potential to tolerate intense rehabilitation      Barriers to Discharge        Equipment Recommendations  None recommended by PT    Recommendations for Other Services     Frequency 7X/week    Precautions / Restrictions Precautions Precautions: Knee Restrictions Other Position/Activity Restrictions: WBAT LLE   Pertinent Vitals/Pain C/o thigh soreness      Mobility  Bed Mobility Bed Mobility: Supine to Sit Supine to Sit: 4: Min assist Details for Bed Mobility Assistance: cues for technique and safety Transfers Transfers: Sit to Stand;Stand to Sit Sit to Stand: 4: Min assist Details for Transfer Assistance: verbal cues for hand placement Ambulation/Gait Ambulation/Gait Assistance: 4: Min assist;4: Min guard Ambulation Distance (Feet): 60 Feet Assistive device: Rolling walker Ambulation/Gait Assistance Details: verbal cues for step length and RW distance from self  Gait Pattern: Step-to pattern;Decreased stance time - left;Antalgic;Trunk flexed    Exercises Total Joint Exercises Ankle Circles/Pumps: AROM;Both;10 reps   PT Diagnosis: Difficulty walking  PT Problem List: Decreased strength;Decreased activity tolerance;Decreased range of motion;Decreased mobility;Decreased knowledge of use of DME;Decreased knowledge of precautions PT Treatment  Interventions: Functional mobility training;Gait training;DME instruction;Therapeutic activities;Therapeutic exercise;Patient/family education     PT Goals(Current goals can be found in the care plan section) Acute Rehab PT Goals Patient Stated Goal: maybe rehab PT Goal Formulation: With patient Time For Goal Achievement: 03/12/13 Potential to Achieve Goals: Good  Visit Information  Last PT Received On: 03/05/13 Assistance Needed: +1 History of Present Illness: s/p L TKA       Prior Functioning  Home Living Family/patient expects to be discharged to:: Skilled nursing facility Living Arrangements: Spouse/significant other;Children Type of Home: House Additional Comments: pt planning for post acute rehab at [facility] Prior Function Level of Independence: Independent Comments: swims and does weight training at gym Communication Communication: No difficulties    Cognition  Cognition Arousal/Alertness: Awake/alert Behavior During Therapy: WFL for tasks assessed/performed Overall Cognitive Status: Within Functional Limits for tasks assessed    Extremity/Trunk Assessment Upper Extremity Assessment Upper Extremity Assessment: Overall WFL for tasks assessed Lower Extremity Assessment Lower Extremity Assessment: LLE deficits/detail LLE Deficits / Details: ankle WFL; able to assist with SLR but with incr pain in thigh   Balance    End of Session PT - End of Session Equipment Utilized During Treatment: Gait belt Activity Tolerance: Patient tolerated treatment well Patient left: in chair;with call bell/phone within reach Nurse Communication: Mobility status CPM Left Knee CPM Left Knee: Off Left Knee Flexion (Degrees): 40 Left Knee Extension (Degrees): 10 Additional Comments:  (tol well)  GP     [facility] 03/05/2013, 3:15 PM

## 2013-03-05 NOTE — Progress Notes (Signed)
Utilization review completed.  

## 2013-03-05 NOTE — Op Note (Signed)
Pre-operative diagnosis- Osteoarthritis  Left knee(s)  Post-operative diagnosis- Osteoarthritis Left knee(s)  Procedure-  Left  Total Knee Arthroplasty  Surgeon- Gus Rankin. Koal Eslinger, MD  Assistant- Dimitri Ped, PA-C   Anesthesia-  Spinal EBL-* No blood loss amount entered *  Drains Hemovac  Tourniquet time-  Total Tourniquet Time Documented: Thigh (Left) - 37 minutes Total: Thigh (Left) - 37 minutes    Complications- None  Condition-PACU - hemodynamically stable.   Brief Clinical Note   James Hendrix is a 79 y.o. year old male with end stage OA of his left knee with progressively worsening pain and dysfunction. He has constant pain, with activity and at rest and significant functional deficits with difficulties even with ADLs. He has had extensive non-op management including analgesics, injections of cortisone and viscosupplements, and home exercise program, but remains in significant pain with significant dysfunction. Radiographs show bone on bone arthritis medial and patellofemoral. He presents now for left Total Knee Arthroplasty.    Procedure in detail---   The patient is brought into the operating room and positioned supine on the operating table. After successful administration of  Spinal,   a tourniquet is placed high on the  Left thigh(s) and the lower extremity is prepped and draped in the usual sterile fashion. Time out is performed by the operating team and then the  Left lower extremity is wrapped in Esmarch, knee flexed and the tourniquet inflated to 300 mmHg.       A midline incision is made with a ten blade through the subcutaneous tissue to the level of the extensor mechanism. A fresh blade is used to make a medial parapatellar arthrotomy. Soft tissue over the proximal medial tibia is subperiosteally elevated to the joint line with a knife and into the semimembranosus bursa with a Cobb elevator. Soft tissue over the proximal lateral tibia is elevated with attention  being paid to avoiding the patellar tendon on the tibial tubercle. The patella is everted, knee flexed 90 degrees and the ACL and PCL are removed. Findings are bone on bone medial and patellofemoral with large medial and patellar osteophytes.        The drill is used to create a starting hole in the distal femur and the canal is thoroughly irrigated with sterile saline to remove the fatty contents. The 5 degree Left  valgus alignment guide is placed into the femoral canal and the distal femoral cutting block is pinned to remove 10 mm off the distal femur. Resection is made with an oscillating saw.      The tibia is subluxed forward and the menisci are removed. The extramedullary alignment guide is placed referencing proximally at the medial aspect of the tibial tubercle and distally along the second metatarsal axis and tibial crest. The block is pinned to remove 2mm off the more deficient medial  side. Resection is made with an oscillating saw. Size 4is the most appropriate size for the tibia and the proximal tibia is prepared with the modular drill and keel punch for that size.      The femoral sizing guide is placed and size 4 is most appropriate. Rotation is marked off the epicondylar axis and confirmed by creating a rectangular flexion gap at 90 degrees. The size 4 cutting block is pinned in this rotation and the anterior, posterior and chamfer cuts are made with the oscillating saw. The intercondylar block is then placed and that cut is made.      Trial size 4 tibial component,  trial size 4 posterior stabilized femur and a 12.5  mm posterior stabilized rotating platform insert trial is placed. Full extension is achieved with excellent varus/valgus and anterior/posterior balance throughout full range of motion. The patella is everted and thickness measured to be 24  mm. Free hand resection is taken to 14 mm, a 38 template is placed, lug holes are drilled, trial patella is placed, and it tracks normally.  Osteophytes are removed off the posterior femur with the trial in place. All trials are removed and the cut bone surfaces prepared with pulsatile lavage. Cement is mixed and once ready for implantation, the size 4 tibial implant, size  4 posterior stabilized femoral component, and the size 38 patella are cemented in place and the patella is held with the clamp. The trial insert is placed and the knee held in full extension. The Exparel (20 ml mixed with 30 ml saline) and .25% Bupivicaine, are injected into the extensor mechanism, posterior capsule, medial and lateral gutters and subcutaneous tissues.  All extruded cement is removed and once the cement is hard the permanent 12.5 mm posterior stabilized rotating platform insert is placed into the tibial tray.      The wound is copiously irrigated with saline solution and the extensor mechanism closed over a hemovac drain with #1 PDS suture. The tourniquet is released for a total tourniquet time of 37  minutes. Flexion against gravity is 140 degrees and the patella tracks normally. Subcutaneous tissue is closed with 2.0 vicryl and subcuticular with running 4.0 Monocryl. The incision is cleaned and dried and steri-strips and a bulky sterile dressing are applied. The limb is placed into a knee immobilizer and the patient is awakened and transported to recovery in stable condition.      Please note that a surgical assistant was a medical necessity for this procedure in order to perform it in a safe and expeditious manner. Surgical assistant was necessary to retract the ligaments and vital neurovascular structures to prevent injury to them and also necessary for proper positioning of the limb to allow for anatomic placement of the prosthesis.   Gus Rankin Bay Jarquin, MD    03/05/2013, 10:29 AM

## 2013-03-05 NOTE — Transfer of Care (Signed)
Immediate Anesthesia Transfer of Care Note  Patient: James Hendrix  Procedure(s) Performed: Procedure(s): LEFT TOTAL KNEE ARTHROPLASTY (Left)  Patient Location: PACU  Anesthesia Type:MAC and Spinal  Level of Consciousness: Patient easily awoken, sedated, comfortable, cooperative, following commands, responds to stimulation.   Airway & Oxygen Therapy: Patient spontaneously breathing, ventilating well, oxygen via simple oxygen mask.  Post-op Assessment: Report given to PACU RN, vital signs reviewed and stable.   Post vital signs: Reviewed and stable.  Complications: No apparent anesthesia complications

## 2013-03-06 ENCOUNTER — Encounter (HOSPITAL_COMMUNITY): Payer: Self-pay | Admitting: Orthopedic Surgery

## 2013-03-06 LAB — BASIC METABOLIC PANEL
CO2: 23 mEq/L (ref 19–32)
Calcium: 8.7 mg/dL (ref 8.4–10.5)
GFR calc Af Amer: 71 mL/min — ABNORMAL LOW (ref >90)
GFR calc non Af Amer: 61 mL/min — ABNORMAL LOW (ref >90)
Potassium: 4.2 mEq/L (ref 3.5–5.1)
Sodium: 136 mEq/L (ref 135–145)

## 2013-03-06 LAB — CBC
HCT: 39.1 % (ref 39.0–52.0)
Hemoglobin: 14.1 g/dL (ref 13.0–17.0)
MCH: 32.4 pg (ref 26.0–34.0)
MCHC: 36.1 g/dL — ABNORMAL HIGH (ref 30.0–36.0)
Platelets: 107 10*3/uL — ABNORMAL LOW (ref 150–400)
WBC: 13.6 10*3/uL — ABNORMAL HIGH (ref 4.0–10.5)

## 2013-03-06 NOTE — Progress Notes (Signed)
OT Cancellation Note  Patient Details Name: James Hendrix MRN: [id number] DOB: April 12, 1934   Cancelled Treatment:    Reason Eval/Treat Not Completed:  Will defer OT eval to SNF.   Lennox Laity [phone] 03/06/2013, 12:41 PM

## 2013-03-06 NOTE — Progress Notes (Signed)
Clinical Social Work Department CLINICAL SOCIAL WORK PLACEMENT NOTE 03/06/2013  Patient:  OVIE, CORNELIO  Account Number:  [id number] Admit date:  03/05/2013  Clinical Social Worker:  Cori Razor, LCSW  Date/time:  03/06/2013 04:00 PM  Clinical Social Work is seeking post-discharge placement for this patient at the following level of care:   SKILLED NURSING   (*CSW will update this form in Epic as items are completed)     Patient/family provided with [facility] Department of Clinical Social Work's list of facilities offering this level of care within the geographic area requested by the patient (or if unable, by the patient's family).  03/06/2013  Patient/family informed of their freedom to choose among providers that offer the needed level of care, that participate in Medicare, Medicaid or managed care program needed by the patient, have an available bed and are willing to accept the patient.    Patient/family informed of [facility]' ownership interest in [facility], as well as of the fact that they are under no obligation to receive care at this facility.  PASARR submitted to EDS on 03/06/2013 PASARR number received from EDS on 03/06/2013  FL2 transmitted to all facilities in geographic area requested by pt/family on  03/06/2013 FL2 transmitted to all facilities within larger geographic area on   Patient informed that his/her managed care company has contracts with or will negotiate with  certain facilities, including the following:     Patient/family informed of bed offers received:  03/06/2013 Patient chooses bed at [facility] Physician recommends and patient chooses bed at    Patient to be transferred to [facility] on   Patient to be transferred to facility by   The following physician request were entered in Epic:   Additional Comments:  Cori Razor LCSW [phone]

## 2013-03-06 NOTE — Progress Notes (Signed)
Physical Therapy Treatment Patient Details Name: James Hendrix MRN: [id number] DOB: 08/08/1933 Today's Date: 03/06/2013 Time: 5621-3086 PT Time Calculation (min): 32 min  PT Assessment / Plan / Recommendation  History of Present Illness s/p L TKA   PT Comments   *Progressing well with mobility. Pt's daughter and wife present for tx. **  Follow Up Recommendations  SNF     Does the patient have the potential to tolerate intense rehabilitation     Barriers to Discharge        Equipment Recommendations  None recommended by PT    Recommendations for Other Services    Frequency 7X/week   Progress towards PT Goals Progress towards PT goals: Progressing toward goals  Plan Current plan remains appropriate    Precautions / Restrictions Precautions Precautions: Knee Restrictions Other Position/Activity Restrictions: WBAT LLE   Pertinent Vitals/Pain *6/10 L knee Ice applied, premedicated**    Mobility  Bed Mobility Bed Mobility: Sit to Supine Supine to Sit: 4: Min assist Sit to Supine: 4: Min assist Details for Bed Mobility Assistance: cues for technique and safety, assist to support LLE Transfers Transfers: Sit to Stand;Stand to Sit Sit to Stand: 4: Min guard;From chair/3-in-1 Stand to Sit: 4: Min guard;To bed Details for Transfer Assistance: verbal cues for hand placement Ambulation/Gait Ambulation/Gait Assistance: 4: Min guard Ambulation Distance (Feet): 90 Feet Assistive device: Rolling walker Gait Pattern: Step-to pattern;Decreased stance time - left;Antalgic;Trunk flexed General Gait Details: VCs to lift head, 7/10 pain L knee with walking    Exercises Total Joint Exercises Ankle Circles/Pumps: AROM;Both;10 reps Quad Sets: AROM;Both;10 reps;Supine Short Arc Quad: AAROM;Left;10 reps;Supine Heel Slides: AAROM;Left;10 reps;Supine Straight Leg Raises: AAROM;Left;10 reps;Supine Long Arc Quad: AAROM;Left;5 reps;Seated Knee Flexion: AAROM;Left;5  reps;Seated Goniometric ROM: L knee flexion AAROM in sitting 80*   PT Diagnosis:    PT Problem List:   PT Treatment Interventions:     PT Goals (current goals can now be found in the care plan section) Acute Rehab PT Goals Patient Stated Goal: rehab at [facility], 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 PT Goal Formulation: With patient Time For Goal Achievement: 03/12/13 Potential to Achieve Goals: Good  Visit Information  Last PT Received On: 03/06/13 Assistance Needed: +1 History of Present Illness: s/p L TKA    Subjective Data  Patient Stated Goal: rehab at [place], return to work as Mining engineer  Cognition Arousal/Alertness: Awake/alert Behavior During Therapy: WFL for tasks assessed/performed Overall Cognitive Status: Within Functional Limits for tasks assessed    Balance     End of Session PT - End of Session Equipment Utilized During Treatment: Gait belt Activity Tolerance: Patient tolerated treatment well Patient left: with call bell/phone within reach;in bed Nurse Communication: Mobility status   GP     Ralene Bathe Kistler 03/06/2013, 12:54 PM [phone]

## 2013-03-06 NOTE — Progress Notes (Signed)
Clinical Social Work Department BRIEF PSYCHOSOCIAL ASSESSMENT 03/06/2013  Patient:  James Hendrix, James Hendrix     Account Number:  [id number]     Admit date:  03/05/2013  Clinical Social Worker:  Candie Chroman  Date/Time:  03/06/2013 03:44 PM  Referred by:  Physician  Date Referred:  03/06/2013 Referred for  SNF Placement   Other Referral:   Interview type:   Other interview type:    PSYCHOSOCIAL DATA Living Status:  WIFE Admitted from facility:   Level of care:   Primary support name:  James Hendrix Primary support relationship to patient:  CHILD, ADULT Degree of support available:   supportive    CURRENT CONCERNS Current Concerns  Post-Acute Placement   Other Concerns:    SOCIAL WORK ASSESSMENT / PLAN Pt is a 79 yr old gentleman living at home prior to hospitalization. CSW met with pt to assist with d/c planning. Pt has made prior arrangements to have ST Rehab at [facility] following hospital d/c. CSW has contacted SNF and d/c plans have been confirmed. CSW will continue to follow to assist with d/c planning to SNF.   Assessment/plan status:  Psychosocial Support/Ongoing Assessment of Needs Other assessment/ plan:   Information/referral to community resources:   None needed at this time.    PATIENT'S/FAMILY'S RESPONSE TO PLAN OF CARE: Pt is looking forward to having rehab at [facility].    Cori Razor LCSW [phone]

## 2013-03-06 NOTE — Progress Notes (Addendum)
   Subjective: 1 Day Post-Op Procedure(s) (LRB): LEFT TOTAL KNEE ARTHROPLASTY (Left) Patient reports pain as mild.   Patient seen in rounds by Dr. Lequita Halt. Patient is well, and has had no acute complaints or problems We will start therapy today.  Plan is to go [facility] after hospital stay.  Objective: Vital signs in last 24 hours: Temp:  [96.9 F (36.1 C)-97.8 F (36.6 C)] 97.4 F (36.3 C) (10/21 0634) Pulse Rate:  [57-95] 67 (10/21 0634) Resp:  [10-19] 16 (10/21 0634) BP: (99-170)/(58-82) 125/77 mmHg (10/21 0634) SpO2:  [96 %-100 %] 98 % (10/21 0634) Weight:  [84.2 kg (185 lb 10 oz)] 84.2 kg (185 lb 10 oz) (10/20 1300)  Intake/Output from previous day:  Intake/Output Summary (Last 24 hours) at 03/06/13 0803 Last data filed at 03/06/13 0643  Gross per 24 hour  Intake 4348.33 ml  Output   4660 ml  Net -311.67 ml    Intake/Output this shift:    Labs:  Recent Labs  03/06/13 0442  HGB 14.1    Recent Labs  03/06/13 0442  WBC 13.6*  RBC 4.35  HCT 39.1  PLT 107*    Recent Labs  03/06/13 0442  NA 136  K 4.2  CL 104  CO2 23  BUN 20  CREATININE 1.13  GLUCOSE 171*  CALCIUM 8.7   No results found for this basename: LABPT, INR,  in the last 72 hours  EXAM General - Patient is Alert, Appropriate and Oriented Extremity - Neurovascular intact Sensation intact distally Dorsiflexion/Plantar flexion intact Dressing - dressing C/D/I Motor Function - intact, moving foot and toes well on exam.  Hemovac pulled without difficulty.  Past Medical History  Diagnosis Date  . Hypertension   . Chronic kidney disease     ckr stage 3  . Diabetes mellitus without complication     diet  . Gout     Assessment/Plan: 1 Day Post-Op Procedure(s) (LRB): LEFT TOTAL KNEE ARTHROPLASTY (Left) Principal Problem:   OA (osteoarthritis) of knee  Estimated body mass index is 28.13 kg/(m^2) as calculated from the following:   Height as of this encounter: 5' 8.11" (1.73  m).   Weight as of this encounter: 84.2 kg (185 lb 10 oz). Up with therapy Discharge to SNF - Plans to go to [facility] after there hospital stay.  DVT Prophylaxis - Xarelto Weight-Bearing as tolerated to left leg D/C O2 and Pulse OX and try on Room Air  Majed Pellegrin 03/06/2013, 8:03 AM

## 2013-03-06 NOTE — Progress Notes (Signed)
Physical Therapy Treatment Patient Details Name: KIEON LAWHORN MRN: [id number] DOB: [birth year] Today's Date: 03/06/2013 Time: 0820-0900 PT Time Calculation (min): 40 min  PT Assessment / Plan / Recommendation  History of Present Illness s/p L TKA   PT Comments   *Pt is progressing very well with mobility. Ambulated 160' with RW. Plans to DC to [facility] **  Follow Up Recommendations  SNF     Does the patient have the potential to tolerate intense rehabilitation     Barriers to Discharge        Equipment Recommendations  None recommended by PT    Recommendations for Other Services    Frequency 7X/week   Progress towards PT Goals Progress towards PT goals: Progressing toward goals  Plan Current plan remains appropriate    Precautions / Restrictions Precautions Precautions: Knee Restrictions Other Position/Activity Restrictions: WBAT LLE   Pertinent Vitals/Pain **1/10 at rest, 7/10 walking L knee Ice applied, premedicated*    Mobility  Bed Mobility Bed Mobility: Supine to Sit Supine to Sit: 4: Min assist Details for Bed Mobility Assistance: cues for technique and safety, assist to support LLE Transfers Transfers: Sit to Stand;Stand to Sit Sit to Stand: 4: Min assist Stand to Sit: 4: Min guard Details for Transfer Assistance: verbal cues for hand placement; assist to steady Ambulation/Gait Ambulation/Gait Assistance: 4: Min guard Ambulation Distance (Feet): 160 Feet Assistive device: Rolling walker Gait Pattern: Step-to pattern;Decreased stance time - left;Antalgic;Trunk flexed General Gait Details: VCs to lift head, 7/10 pain L knee with walking    Exercises Total Joint Exercises Ankle Circles/Pumps: AROM;Both;10 reps Quad Sets: AROM;Both;10 reps;Supine Short Arc Quad: AAROM;Left;10 reps;Supine Heel Slides: AAROM;Left;10 reps;Supine Straight Leg Raises: AAROM;Left;10 reps;Supine Goniometric ROM: flexion L knee AAROM 50*   PT Diagnosis:    PT Problem  List:   PT Treatment Interventions:     PT Goals (current goals can now be found in the care plan section) Acute Rehab PT Goals Patient Stated Goal: rehab at [facility], 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 PT Goal Formulation: With patient Time For Goal Achievement: 03/12/13 Potential to Achieve Goals: Good  Visit Information  Last PT Received On: 03/06/13 Assistance Needed: +1 History of Present Illness: s/p L TKA    Subjective Data  Patient Stated Goal: rehab at [place], return to work as Mining engineer  Cognition Arousal/Alertness: Awake/alert Behavior During Therapy: WFL for tasks assessed/performed Overall Cognitive Status: Within Functional Limits for tasks assessed    Balance     End of Session PT - End of Session Equipment Utilized During Treatment: Gait belt Activity Tolerance: Patient tolerated treatment well Patient left: in chair;with call bell/phone within reach Nurse Communication: Mobility status   GP     Ralene Bathe Kistler 03/06/2013, 9:03 AM [phone]

## 2013-03-07 LAB — BASIC METABOLIC PANEL
BUN: 20 mg/dL (ref 6–23)
CO2: 27 mEq/L (ref 19–32)
Creatinine, Ser: 1.13 mg/dL (ref 0.50–1.35)
GFR calc Af Amer: 71 mL/min — ABNORMAL LOW (ref >90)
GFR calc non Af Amer: 61 mL/min — ABNORMAL LOW (ref >90)
Glucose, Bld: 145 mg/dL — ABNORMAL HIGH (ref 70–99)
Potassium: 4.3 mEq/L (ref 3.5–5.1)
Sodium: 135 mEq/L (ref 135–145)

## 2013-03-07 LAB — CBC
HCT: 38 % — ABNORMAL LOW (ref 39.0–52.0)
Hemoglobin: 13.4 g/dL (ref 13.0–17.0)
MCHC: 35.3 g/dL (ref 30.0–36.0)
MCV: 91.3 fL (ref 78.0–100.0)
WBC: 13 10*3/uL — ABNORMAL HIGH (ref 4.0–10.5)

## 2013-03-07 NOTE — Progress Notes (Signed)
   Subjective: 2 Days Post-Op Procedure(s) (LRB): LEFT TOTAL KNEE ARTHROPLASTY (Left) Patient reports pain as moderate.   Patient seen in rounds with Dr. Lequita Halt.  Knee has been hurting some. Patient is well, but has had some minor complaints of pain in the knee, requiring pain medications Plan is to go Skilled nursing facility after hospital stay.  Objective: Vital signs in last 24 hours: Temp:  [97.7 F (36.5 C)-98.8 F (37.1 C)] 98.8 F (37.1 C) (10/22 0641) Pulse Rate:  [60-89] 68 (10/22 0641) Resp:  [14-19] 14 (10/22 0641) BP: (127-148)/(69-81) 146/81 mmHg (10/22 0641) SpO2:  [95 %-97 %] 95 % (10/22 0641)  Intake/Output from previous day:  Intake/Output Summary (Last 24 hours) at 03/07/13 0731 Last data filed at 03/07/13 0538  Gross per 24 hour  Intake 1809.33 ml  Output   2550 ml  Net -740.67 ml     Labs:  Recent Labs  03/06/13 0442 03/07/13 0435  HGB 14.1 13.4    Recent Labs  03/06/13 0442 03/07/13 0435  WBC 13.6* 13.0*  RBC 4.35 4.16*  HCT 39.1 38.0*  PLT 107* 109*    Recent Labs  03/06/13 0442 03/07/13 0435  NA 136 135  K 4.2 4.3  CL 104 103  CO2 23 27  BUN 20 20  CREATININE 1.13 1.13  GLUCOSE 171* 145*  CALCIUM 8.7 8.9   No results found for this basename: LABPT, INR,  in the last 72 hours  EXAM General - Patient is Alert, Appropriate and Oriented Extremity - Neurovascular intact Sensation intact distally Dorsiflexion/Plantar flexion intact No cellulitis present Dressing/Incision - clean, dry, no drainage Motor Function - intact, moving foot and toes well on exam.   Past Medical History  Diagnosis Date  . Hypertension   . Chronic kidney disease     ckr stage 3  . Diabetes mellitus without complication     diet  . Gout     Assessment/Plan: 2 Days Post-Op Procedure(s) (LRB): LEFT TOTAL KNEE ARTHROPLASTY (Left) Principal Problem:   OA (osteoarthritis) of knee  Estimated body mass index is 28.13 kg/(m^2) as calculated from  the following:   Height as of this encounter: 5' 8.11" (1.73 m).   Weight as of this encounter: 84.2 kg (185 lb 10 oz). Up with therapy Plan for discharge tomorrow Discharge to SNF - [facility]  DVT Prophylaxis - Xarelto Weight-Bearing as tolerated to left leg  Dannia Snook 03/07/2013, 7:31 AM

## 2013-03-07 NOTE — Progress Notes (Signed)
Physical Therapy Treatment Patient Details Name: James Hendrix MRN: [id number] DOB: 1934/01/02 Today's Date: 03/07/2013 Time: 4098-1191 PT Time Calculation (min): 20 min  PT Assessment / Plan / Recommendation  History of Present Illness s/p L TKA   PT Comments   plans DC tomorrow to SNF. Tolerating ambulation without KI. Cues for safe use of RW.and posture.  Follow Up Recommendations        Does the patient have the potential to tolerate intense rehabilitation     Barriers to Discharge        Equipment Recommendations       Recommendations for Other Services    Frequency     Progress towards PT Goals Progress towards PT goals: Progressing toward goals  Plan      Precautions / Restrictions Precautions Precautions: Knee   Pertinent Vitals/Pain 6 L knee ,after meds    Mobility  Bed Mobility Sit to Supine: 4: Min guard Details for Bed Mobility Assistance: used R foot to pick up l Transfers Sit to Stand: 4: Min guard;With upper extremity assist;From chair/3-in-1 Stand to Sit: 4: Min guard;With upper extremity assist;To bed Details for Transfer Assistance: verbal cues for hand placement Ambulation/Gait Ambulation/Gait Assistance: 4: Min guard Ambulation Distance (Feet): 160 Feet Assistive device: Rolling walker Gait Pattern: Step-through pattern;Decreased step length - right;Decreased stance time - left;Antalgic General Gait Details: VCs to lift head,     Exercises Total Joint Exercises Heel Slides: AAROM;Left;10 reps;Supine Hip ABduction/ADduction: AAROM;Left;10 reps;Supine Straight Leg Raises: AAROM;Left;15 reps Goniometric ROM: 10-80 l knee   PT Diagnosis:    PT Problem List:   PT Treatment Interventions:     PT Goals (current goals can now be found in the care plan section)    Visit Information  Last PT Received On: 03/07/13 History of Present Illness: s/p L TKA    Subjective Data      Cognition  Cognition Arousal/Alertness: Awake/alert     Balance     End of Session PT - End of Session Activity Tolerance: Patient tolerated treatment well Patient left: in bed CPM Left Knee CPM Left Knee: Off   GP     [name] 03/07/2013, 5:21 PM

## 2013-03-07 NOTE — Progress Notes (Signed)
Physical Therapy Treatment Patient Details Name: ACESON LABELL MRN: [id number] DOB: [birth year] Today's Date: 03/07/2013 Time: 7829-5621 PT Time Calculation (min): 32 min  PT Assessment / Plan / Recommendation  History of Present Illness s/p L TKA   PT Comments   Pt ambulated without KI. Pt tolerated well. Pt plans Snf after DC.  Follow Up Recommendations        Does the patient have the potential to tolerate intense rehabilitation     Barriers to Discharge        Equipment Recommendations  None recommended by PT    Recommendations for Other Services    Frequency 7X/week   Progress towards PT Goals Progress towards PT goals: Progressing toward goals  Plan Current plan remains appropriate    Precautions / Restrictions Precautions Precautions: Knee Required Braces or Orthoses: Knee Immobilizer - Left (did not use.)   Pertinent Vitals/Pain Pain < 3 with activity    Mobility  Bed Mobility Supine to Sit: 4: Min assist Details for Bed Mobility Assistance: cues for technique and safety, assist to support LLE Transfers Sit to Stand: 4: Min guard;From bed;With upper extremity assist Stand to Sit: To chair/3-in-1;4: Min guard;With upper extremity assist Details for Transfer Assistance: verbal cues for hand placement Ambulation/Gait Ambulation/Gait Assistance: 4: Min guard Ambulation Distance (Feet): 160 Feet Assistive device: Rolling walker Ambulation/Gait Assistance Details: verbal cues for step length and safe use of RW Gait Pattern: Step-through pattern;Decreased step length - right;Decreased stance time - left;Antalgic General Gait Details: VCs to lift head,     Exercises Total Joint Exercises Ankle Circles/Pumps: AROM;Both;10 reps Quad Sets: AROM;Both;10 reps;Supine Long Arc Quad: AAROM;Left;Seated;10 reps Knee Flexion: AAROM;Left;Seated;10 reps AROM 10-50   PT Diagnosis:    PT Problem List:   PT Treatment Interventions:     PT Goals (current goals can now  be found in the care plan section)    Visit Information  Last PT Received On: 03/07/13 Assistance Needed: +1 History of Present Illness: s/p L TKA    Subjective Data      Cognition  Cognition Arousal/Alertness: Awake/alert    Balance     End of Session PT - End of Session Activity Tolerance: Patient tolerated treatment well Patient left: with call bell/phone within reach;in chair Nurse Communication: Mobility status   GP     [name] 03/07/2013, 12:23 PM

## 2013-03-08 LAB — CBC
HCT: 36.1 % — ABNORMAL LOW (ref 39.0–52.0)
Hemoglobin: 12.6 g/dL — ABNORMAL LOW (ref 13.0–17.0)
MCH: 32 pg (ref 26.0–34.0)
MCHC: 34.9 g/dL (ref 30.0–36.0)
RBC: 3.94 MIL/uL — ABNORMAL LOW (ref 4.22–5.81)

## 2013-03-08 MED ORDER — METOCLOPRAMIDE HCL 5 MG PO TABS: 5.0000 mg | ORAL_TABLET | Freq: Three times a day (TID) | ORAL | Status: DC | PRN

## 2013-03-08 MED ORDER — TRAMADOL HCL 50 MG PO TABS: 50.0000 mg | ORAL_TABLET | Freq: Four times a day (QID) | ORAL | Status: DC | PRN

## 2013-03-08 MED ORDER — BISACODYL 10 MG RE SUPP: 10.0000 mg | Freq: Every day | RECTAL | Status: DC | PRN

## 2013-03-08 MED ORDER — ACETAMINOPHEN 325 MG PO TABS: 650.0000 mg | ORAL_TABLET | Freq: Four times a day (QID) | ORAL | Status: AC | PRN

## 2013-03-08 MED ORDER — OXYCODONE HCL 5 MG PO TABS: 5.0000 mg | ORAL_TABLET | ORAL | Status: DC | PRN

## 2013-03-08 MED ORDER — ONDANSETRON HCL 4 MG PO TABS: 4.0000 mg | ORAL_TABLET | Freq: Four times a day (QID) | ORAL | Status: DC | PRN

## 2013-03-08 MED ORDER — DSS 100 MG PO CAPS: 100.0000 mg | ORAL_CAPSULE | Freq: Two times a day (BID) | ORAL | Status: AC

## 2013-03-08 MED ORDER — POLYETHYLENE GLYCOL 3350 17 G PO PACK: 17.0000 g | PACK | Freq: Every day | ORAL | Status: DC | PRN

## 2013-03-08 MED ORDER — RIVAROXABAN 10 MG PO TABS: 10.0000 mg | ORAL_TABLET | Freq: Every day | ORAL | Status: DC

## 2013-03-08 MED ORDER — METHOCARBAMOL 500 MG PO TABS: 500.0000 mg | ORAL_TABLET | Freq: Four times a day (QID) | ORAL | Status: DC | PRN

## 2013-03-08 NOTE — Progress Notes (Signed)
   Subjective: 3 Days Post-Op Procedure(s) (LRB): LEFT TOTAL KNEE ARTHROPLASTY (Left) Patient reports pain as mild.   Patient seen in rounds with Dr. Lequita Halt. Patient is well, and has had no acute complaints or problems Patient is ready to go to [facility].  Objective: Vital signs in last 24 hours: Temp:  [97.4 F (36.3 C)-98.3 F (36.8 C)] 98.1 F (36.7 C) (10/23 0436) Pulse Rate:  [79-86] 86 (10/23 0436) Resp:  [16] 16 (10/23 0436) BP: (127-149)/(70-88) 127/70 mmHg (10/23 0436) SpO2:  [93 %-96 %] 96 % (10/23 0436)  Intake/Output from previous day:  Intake/Output Summary (Last 24 hours) at 03/08/13 0806 Last data filed at 03/08/13 2956  Gross per 24 hour  Intake    480 ml  Output   1475 ml  Net   -995 ml    Intake/Output this shift: Total I/O In: -  Out: 500 [Urine:500]  Labs:  Recent Labs  03/06/13 0442 03/07/13 0435 03/08/13 0425  HGB 14.1 13.4 12.6*    Recent Labs  03/07/13 0435 03/08/13 0425  WBC 13.0* 9.7  RBC 4.16* 3.94*  HCT 38.0* 36.1*  PLT 109* 102*    Recent Labs  03/06/13 0442 03/07/13 0435  NA 136 135  K 4.2 4.3  CL 104 103  CO2 23 27  BUN 20 20  CREATININE 1.13 1.13  GLUCOSE 171* 145*  CALCIUM 8.7 8.9   No results found for this basename: LABPT, INR,  in the last 72 hours  EXAM: General - Patient is Alert, Appropriate and Oriented Extremity - Neurovascular intact Sensation intact distally Dorsiflexion/Plantar flexion intact No cellulitis present Incision - clean, dry, no drainage, healing Motor Function - intact, moving foot and toes well on exam.   Assessment/Plan: 3 Days Post-Op Procedure(s) (LRB): LEFT TOTAL KNEE ARTHROPLASTY (Left) Procedure(s) (LRB): LEFT TOTAL KNEE ARTHROPLASTY (Left) Past Medical History  Diagnosis Date  . Hypertension   . Chronic kidney disease     ckr stage 3  . Diabetes mellitus without complication     diet  . Gout    Principal Problem:   OA (osteoarthritis) of knee  Estimated  body mass index is 28.13 kg/(m^2) as calculated from the following:   Height as of this encounter: 5' 8.11" (1.73 m).   Weight as of this encounter: 84.2 kg (185 lb 10 oz). Up with therapy Discharge to SNF Diet - Cardiac diet and Diabetic diet Follow up - in 2 weeks Activity - WBAT Disposition - Skilled nursing facility Condition Upon Discharge - Good D/C Meds - See DC Summary DVT Prophylaxis - Xarelto  PERKINS, ALEXZANDREW 03/08/2013, 8:06 AM

## 2013-03-08 NOTE — Progress Notes (Signed)
Physical Therapy Treatment Patient Details Name: James Hendrix MRN: [id number] DOB: 22-Jul-1933 Today's Date: 03/08/2013 Time: 7829-5621 PT Time Calculation (min): 25 min  PT Assessment / Plan / Recommendation  History of Present Illness s/p L TKA   PT Comments   POD # 3 pt plans to D/C to [facility] for [facility] Rehab.  Assisted pt  OOB to amb in hallway then position in recliner.  Pt c/o 7/10 knee pain, requested a muscle relaxor and applied ICE.   Follow Up Recommendations  SNF     Does the patient have the potential to tolerate intense rehabilitation     Barriers to Discharge        Equipment Recommendations       Recommendations for Other Services    Frequency     Progress towards PT Goals Progress towards PT goals: Progressing toward goals  Plan      Precautions / Restrictions Precautions Precautions: Knee Restrictions Weight Bearing Restrictions: No Other Position/Activity Restrictions: WBAT LLE    Pertinent Vitals/Pain C/o 7/10 pain ICE applied    Mobility  Bed Mobility Bed Mobility: Sit to Supine Sit to Supine: 4: Min guard Details for Bed Mobility Assistance: min guard assist L LE off bed plus increased time Transfers Transfers: Sit to Stand;Stand to Sit Sit to Stand: 4: Min guard;From bed Stand to Sit: 4: Min guard;To chair/3-in-1 Details for Transfer Assistance: 25% VC's on proper tech and hand placement Ambulation/Gait Ambulation/Gait Assistance: 4: Min guard Ambulation Distance (Feet): 85 Feet Assistive device: Rolling walker Ambulation/Gait Assistance Details: <25% Vc's on safety with turns and proper walker to self distance with upright posture Gait Pattern: Step-through pattern;Decreased step length - right;Decreased stance time - left;Antalgic Gait velocity: decreased    PT Goals (current goals can now be found in the care plan section)    Visit Information  Last PT Received On: 03/08/13 Assistance Needed: +1 History of Present Illness:  s/p L TKA    Subjective Data      Cognition       Balance     End of Session PT - End of Session Equipment Utilized During Treatment: Gait belt Activity Tolerance: Patient tolerated treatment well Patient left: in chair;with call bell/phone within reach CPM Left Knee CPM Left Knee: Off   Felecia Shelling  PTA [facility]  Acute  Rehab Pager      [phone]

## 2013-03-08 NOTE — Progress Notes (Signed)
Called [facility] to give report no answer left on hold for several minutes  [employer]

## 2013-03-08 NOTE — Discharge Summary (Signed)
Physician Discharge Summary   Patient ID: James Hendrix MRN: [id number] DOB/AGE: [birth year] 77 y.o.  Admit date: 03/05/2013 Discharge date: 03/08/2013  Primary Diagnosis:  Osteoarthritis Left knee(s)  Admission Diagnoses:  Past Medical History  Diagnosis Date  . Hypertension   . Chronic kidney disease     ckr stage 3  . Diabetes mellitus without complication     diet  . Gout    Discharge Diagnoses:   Principal Problem:   OA (osteoarthritis) of knee  Estimated body mass index is 28.13 kg/(m^2) as calculated from the following:   Height as of this encounter: 5' 8.11" (1.73 m).   Weight as of this encounter: 84.2 kg (185 lb 10 oz).  Procedure:  Procedure(s) (LRB): LEFT TOTAL KNEE ARTHROPLASTY (Left)   Consults: None  HPI: James Hendrix is a 79 y.o. year old male with end stage OA of his left knee with progressively worsening pain and dysfunction. He has constant pain, with activity and at rest and significant functional deficits with difficulties even with ADLs. He has had extensive non-op management including analgesics, injections of cortisone and viscosupplements, and home exercise program, but remains in significant pain with significant dysfunction. Radiographs show bone on bone arthritis medial and patellofemoral. He presents now for left Total Knee Arthroplasty.   Laboratory Data: Admission on 03/05/2013  Component Date Value Range Status  . Glucose-Capillary 03/05/2013 120* 70 - 99 mg/dL Final  . Comment 1 11/91/4782 Documented in Chart   Final  . Comment 2 03/05/2013 Notify RN   Final  . WBC 03/06/2013 13.6* 4.0 - 10.5 K/uL Final  . RBC 03/06/2013 4.35  4.22 - 5.81 MIL/uL Final  . Hemoglobin 03/06/2013 14.1  13.0 - 17.0 g/dL Final  . HCT 95/62/1308 39.1  39.0 - 52.0 % Final  . MCV 03/06/2013 89.9  78.0 - 100.0 fL Final  . MCH 03/06/2013 32.4  26.0 - 34.0 pg Final  . MCHC 03/06/2013 36.1* 30.0 - 36.0 g/dL Final  . RDW 65/78/4696 12.4  11.5 - 15.5 % Final    . Platelets 03/06/2013 107* 150 - 400 K/uL Final  . Sodium 03/06/2013 136  135 - 145 mEq/L Final  . Potassium 03/06/2013 4.2  3.5 - 5.1 mEq/L Final  . Chloride 03/06/2013 104  96 - 112 mEq/L Final  . CO2 03/06/2013 23  19 - 32 mEq/L Final  . Glucose, Bld 03/06/2013 171* 70 - 99 mg/dL Final  . BUN 29/52/8413 20  6 - 23 mg/dL Final  . Creatinine, Ser 03/06/2013 1.13  0.50 - 1.35 mg/dL Final  . Calcium 24/40/1027 8.7  8.4 - 10.5 mg/dL Final  . GFR calc non Af Amer 03/06/2013 61* >90 mL/min Final  . GFR calc Af Amer 03/06/2013 71* >90 mL/min Final   Comment: (NOTE)                          The eGFR has been calculated using the CKD EPI equation.                          This calculation has not been validated in all clinical situations.                          eGFR's persistently <90 mL/min signify possible Chronic Kidney  Disease.  . WBC 03/07/2013 13.0* 4.0 - 10.5 K/uL Final  . RBC 03/07/2013 4.16* 4.22 - 5.81 MIL/uL Final  . Hemoglobin 03/07/2013 13.4  13.0 - 17.0 g/dL Final  . HCT 16/02/9603 38.0* 39.0 - 52.0 % Final  . MCV 03/07/2013 91.3  78.0 - 100.0 fL Final  . MCH 03/07/2013 32.2  26.0 - 34.0 pg Final  . MCHC 03/07/2013 35.3  30.0 - 36.0 g/dL Final  . RDW 54/01/8118 12.9  11.5 - 15.5 % Final  . Platelets 03/07/2013 109* 150 - 400 K/uL Final   CONSISTENT WITH PREVIOUS RESULT  . Sodium 03/07/2013 135  135 - 145 mEq/L Final  . Potassium 03/07/2013 4.3  3.5 - 5.1 mEq/L Final   SLIGHT HEMOLYSIS  . Chloride 03/07/2013 103  96 - 112 mEq/L Final  . CO2 03/07/2013 27  19 - 32 mEq/L Final  . Glucose, Bld 03/07/2013 145* 70 - 99 mg/dL Final  . BUN 14/78/2956 20  6 - 23 mg/dL Final  . Creatinine, Ser 03/07/2013 1.13  0.50 - 1.35 mg/dL Final  . Calcium 21/30/8657 8.9  8.4 - 10.5 mg/dL Final  . GFR calc non Af Amer 03/07/2013 61* >90 mL/min Final  . GFR calc Af Amer 03/07/2013 71* >90 mL/min Final   Comment: (NOTE)                          The eGFR has been  calculated using the CKD EPI equation.                          This calculation has not been validated in all clinical situations.                          eGFR's persistently <90 mL/min signify possible Chronic Kidney                          Disease.  . WBC 03/08/2013 9.7  4.0 - 10.5 K/uL Final  . RBC 03/08/2013 3.94* 4.22 - 5.81 MIL/uL Final  . Hemoglobin 03/08/2013 12.6* 13.0 - 17.0 g/dL Final  . HCT 84/69/6295 36.1* 39.0 - 52.0 % Final  . MCV 03/08/2013 91.6  78.0 - 100.0 fL Final  . MCH 03/08/2013 32.0  26.0 - 34.0 pg Final  . MCHC 03/08/2013 34.9  30.0 - 36.0 g/dL Final  . RDW 28/41/3244 13.0  11.5 - 15.5 % Final  . Platelets 03/08/2013 102* 150 - 400 K/uL Final   CONSISTENT WITH PREVIOUS RESULT  Hospital Outpatient Visit on 02/27/2013  Component Date Value Range Status  . MRSA, PCR 02/27/2013 NEGATIVE  NEGATIVE Final  . Staphylococcus aureus 02/27/2013 NEGATIVE  NEGATIVE Final   Comment:                                 The Xpert SA Assay (FDA                          approved for NASAL specimens                          in patients over 35 years of age),  is one component of                          a comprehensive surveillance                          program.  Test performance has                          been validated by [facility] for patients greater                          than or equal to 15 year old.                          It is not intended                          to diagnose infection nor to                          guide or monitor treatment.  Marland Kitchen aPTT 02/27/2013 30  24 - 37 seconds Final  . WBC 02/27/2013 6.6  4.0 - 10.5 K/uL Final  . RBC 02/27/2013 5.02  4.22 - 5.81 MIL/uL Final  . Hemoglobin 02/27/2013 16.2  13.0 - 17.0 g/dL Final  . HCT 29/52/8413 46.0  39.0 - 52.0 % Final  . MCV 02/27/2013 91.6  78.0 - 100.0 fL Final  . MCH 02/27/2013 32.3  26.0 - 34.0 pg Final  . MCHC 02/27/2013 35.2  30.0 - 36.0 g/dL  Final  . RDW 24/40/1027 12.7  11.5 - 15.5 % Final  . Platelets 02/27/2013 113* 150 - 400 K/uL Final   Comment: SPECIMEN CHECKED FOR CLOTS                          REPEATED TO VERIFY                          PLATELET COUNT CONFIRMED BY SMEAR  . Sodium 02/27/2013 138  135 - 145 mEq/L Final  . Potassium 02/27/2013 4.4  3.5 - 5.1 mEq/L Final  . Chloride 02/27/2013 102  96 - 112 mEq/L Final  . CO2 02/27/2013 28  19 - 32 mEq/L Final  . Glucose, Bld 02/27/2013 106* 70 - 99 mg/dL Final  . BUN 25/36/6440 24* 6 - 23 mg/dL Final  . Creatinine, Ser 02/27/2013 1.23  0.50 - 1.35 mg/dL Final  . Calcium 34/74/2595 10.1  8.4 - 10.5 mg/dL Final  . Total Protein 02/27/2013 7.0  6.0 - 8.3 g/dL Final  . Albumin 63/87/5643 3.9  3.5 - 5.2 g/dL Final  . AST 32/95/1884 24  0 - 37 U/L Final  . ALT 02/27/2013 21  0 - 53 U/L Final  . Alkaline Phosphatase 02/27/2013 71  39 - 117 U/L Final  . Total Bilirubin 02/27/2013 0.5  0.3 - 1.2 mg/dL Final  . GFR calc non Af Amer 02/27/2013 55* >90 mL/min Final  . GFR calc Af Amer 02/27/2013 64* >90 mL/min Final   Comment: (NOTE)  The eGFR has been calculated using the CKD EPI equation.                          This calculation has not been validated in all clinical situations.                          eGFR's persistently <90 mL/min signify possible Chronic Kidney                          Disease.  Marland Kitchen Prothrombin Time 02/27/2013 12.6  11.6 - 15.2 seconds Final  . INR 02/27/2013 0.96  0.00 - 1.49 Final  . ABO/RH(D) 02/27/2013 O POS   Final  . Antibody Screen 02/27/2013 NEG   Final  . Sample Expiration 02/27/2013 03/08/2013   Final  . Color, Urine 02/27/2013 YELLOW  YELLOW Final  . APPearance 02/27/2013 CLEAR  CLEAR Final  . Specific Gravity, Urine 02/27/2013 1.016  1.005 - 1.030 Final  . pH 02/27/2013 7.0  5.0 - 8.0 Final  . Glucose, UA 02/27/2013 NEGATIVE  NEGATIVE mg/dL Final  . Hgb urine dipstick 02/27/2013 NEGATIVE  NEGATIVE Final  . Bilirubin  Urine 02/27/2013 NEGATIVE  NEGATIVE Final  . Ketones, ur 02/27/2013 NEGATIVE  NEGATIVE mg/dL Final  . Protein, ur 16/02/9603 NEGATIVE  NEGATIVE mg/dL Final  . Urobilinogen, UA 02/27/2013 0.2  0.0 - 1.0 mg/dL Final  . Nitrite 54/01/8118 NEGATIVE  NEGATIVE Final  . Leukocytes, UA 02/27/2013 LARGE* NEGATIVE Final  . ABO/RH(D) 02/27/2013 O POS   Final  . WBC, UA 02/27/2013 7-10  <3 WBC/hpf Final  . Urine-Other 02/27/2013 MUCOUS PRESENT   Final     X-Rays:Dg Chest 2 View  02/27/2013   CLINICAL DATA:  Hypertension.  EXAM: CHEST  2 VIEW  COMPARISON:  None.  FINDINGS: The heart size and mediastinal contours are within normal limits. Both lungs are clear. Anterior osteophyte formation is noted throughout the thoracic spine.  IMPRESSION: No active cardiopulmonary disease.   Electronically Signed   By: Roque Lias M.D.   On: 02/27/2013 14:34    EKG:No orders found for this or any previous visit.   Hospital Course: James Hendrix is a 79 y.o. who was admitted to [facility]. They were brought to the operating room on 03/05/2013 and underwent Procedure(s): LEFT TOTAL KNEE ARTHROPLASTY.  Patient tolerated the procedure well and was later transferred to the recovery room and then to the orthopaedic floor for postoperative care.  They were given PO and IV analgesics for pain control following their surgery.  They were given 24 hours of postoperative antibiotics of  Anti-infectives   Start     Dose/Rate Route Frequency Ordered Stop   03/05/13 1600  ceFAZolin (ANCEF) IVPB 2 g/50 mL premix     2 g 100 mL/hr over 30 Minutes Intravenous 4 times per day 03/05/13 1148 03/06/13 0120   03/05/13 0800  ceFAZolin (ANCEF) IVPB 2 g/50 mL premix     2 g 100 mL/hr over 30 Minutes Intravenous On call to O.R. 03/05/13 1478 03/05/13 0923     and started on DVT prophylaxis in the form of Xarelto.   PT and OT were ordered for total joint protocol.  Discharge planning consulted to help with postop disposition and  equipment needs. Social worker was consulted to assist with placement into SNF for inpatient rehab.  Patient had a decent night on  the evening of surgery.  They started to get up OOB with therapy on day one. Hemovac drain was pulled without difficulty.  Continued to work with therapy into day two.  Dressing was changed on day two and the incision was healing well.  By day three, the patient had progressed with therapy and meeting their goals.  Incision was healing well.  Patient was seen in rounds and was ready to go to [facility].   Discharge Medications: Prior to Admission medications   Medication Sig Start Date End Date Taking? Authorizing Provider  acetaminophen (TYLENOL) 325 MG tablet Take 2 tablets (650 mg total) by mouth every 6 (six) hours as needed. 03/08/13   Sunjai Levandoski, PA-C  bisacodyl (DULCOLAX) 10 MG suppository Place 1 suppository (10 mg total) rectally daily as needed. 03/08/13   Cayce Quezada, PA-C  docusate sodium 100 MG CAPS Take 100 mg by mouth 2 (two) times daily. 03/08/13   Amellia Panik, PA-C  finasteride (PROSCAR) 5 MG tablet Take 5 mg by mouth daily.   Yes Historical Provider, MD  lisinopril (PRINIVIL,ZESTRIL) 10 MG tablet Take 10 mg by mouth every morning.   Yes Historical Provider, MD  methocarbamol (ROBAXIN) 500 MG tablet Take 1 tablet (500 mg total) by mouth every 6 (six) hours as needed. 03/08/13   Hamdi Kley Julien Girt, PA-C  metoCLOPramide (REGLAN) 5 MG tablet Take 1-2 tablets (5-10 mg total) by mouth every 8 (eight) hours as needed (if ondansetron (ZOFRAN) ineffective.). 03/08/13   Jaylianna Tatlock, PA-C  ondansetron (ZOFRAN) 4 MG tablet Take 1 tablet (4 mg total) by mouth every 6 (six) hours as needed for nausea. 03/08/13   Tyrone Pautsch, PA-C  oxyCODONE (OXY IR/ROXICODONE) 5 MG immediate release tablet Take 1-2 tablets (5-10 mg total) by mouth every 3 (three) hours as needed. 03/08/13   Koby Pickup, PA-C  polyethylene glycol  (MIRALAX / GLYCOLAX) packet Take 17 g by mouth daily as needed. 03/08/13   Rykin Route Julien Girt, PA-C  rivaroxaban (XARELTO) 10 MG TABS tablet Take 1 tablet (10 mg total) by mouth daily with breakfast. Take Xarelto for two and a half more weeks, then discontinue Xarelto. Once the patient has completed the Xarelto, they may resume the 81 mg Aspirin. 03/08/13   Beuford Garcilazo, PA-C  simvastatin (ZOCOR) 10 MG tablet Take 10 mg by mouth at bedtime.   Yes Historical Provider, MD  traMADol (ULTRAM) 50 MG tablet Take 1-2 tablets (50-100 mg total) by mouth every 6 (six) hours as needed (mild pain). 03/08/13   Marjon Doxtater Julien Girt, PA-C    Diet: Cardiac diet and Diabetic diet Activity:WBAT Follow-up:in 2 weeks Disposition - Skilled nursing facility - [facility] Discharged Condition: good   Discharge Orders   Future Orders Complete By Expires   Call MD / Call 911  As directed    Comments:     If you experience chest pain or shortness of breath, CALL 911 and be transported to the hospital emergency room.  If you develope a fever above 101 F, pus (white drainage) or increased drainage or redness at the wound, or calf pain, call your surgeon's office.   Change dressing  As directed    Comments:     Change dressing daily with sterile 4 x 4 inch gauze dressing and apply TED hose. Do not submerge the incision under water.   Constipation Prevention  As directed    Comments:     Drink plenty of fluids.  Prune juice may be helpful.  You may use a  stool softener, such as Colace (over the counter) 100 mg twice a day.  Use MiraLax (over the counter) for constipation as needed.   Diet - low sodium heart healthy  As directed    Diet Carb Modified  As directed    Discharge instructions  As directed    Comments:     Pick up stool softner and laxative for home. Do not submerge incision under water. May shower. Continue to use ice for pain and swelling from surgery.  Take Xarelto for two and a half more  weeks, then discontinue Xarelto. Once the patient has completed the Xarelto, they may resume the 81 mg Aspirin.  When discharged from the skilled rehab facility, please have the facility set up the patient's Home Health Physical Therapy prior to being released.  Also provide the patient with their medications at time of release from the facility to include their pain medication, the muscle relaxants, and their blood thinner medication.  If the patient is still at the rehab facility at time of follow up appointment, please also assist the patient in arranging follow up appointment in our office and any transportation needs.   Do not put a pillow under the knee. Place it under the heel.  As directed    Do not sit on low chairs, stoools or toilet seats, as it may be difficult to get up from low surfaces  As directed    Driving restrictions  As directed    Comments:     No driving until released by the physician.   Increase activity slowly as tolerated  As directed    Lifting restrictions  As directed    Comments:     No lifting until released by the physician.   Patient may shower  As directed    Comments:     You may shower without a dressing once there is no drainage.  Do not wash over the wound.  If drainage remains, do not shower until drainage stops.   TED hose  As directed    Comments:     Use stockings (TED hose) for 3 weeks on both leg(s).  You may remove them at night for sleeping.   Weight bearing as tolerated  As directed    Questions:     Laterality:     Extremity:         Medication List    STOP taking these medications       aspirin 81 MG chewable tablet     ciprofloxacin 500 MG tablet  Commonly known as:  CIPRO     Vitamin B12 3000 MCG/ML Liqd     Vitamin D-3 5000 UNITS Tabs      TAKE these medications       acetaminophen 325 MG tablet  Commonly known as:  TYLENOL  Take 2 tablets (650 mg total) by mouth every 6 (six) hours as needed.     bisacodyl 10 MG  suppository  Commonly known as:  DULCOLAX  Place 1 suppository (10 mg total) rectally daily as needed.     DSS 100 MG Caps  Take 100 mg by mouth 2 (two) times daily.     finasteride 5 MG tablet  Commonly known as:  PROSCAR  Take 5 mg by mouth daily.     lisinopril 10 MG tablet  Commonly known as:  PRINIVIL,ZESTRIL  Take 10 mg by mouth every morning.     methocarbamol 500 MG tablet  Commonly known as:  ROBAXIN  Take 1 tablet (500 mg total) by mouth every 6 (six) hours as needed.     metoCLOPramide 5 MG tablet  Commonly known as:  REGLAN  Take 1-2 tablets (5-10 mg total) by mouth every 8 (eight) hours as needed (if ondansetron (ZOFRAN) ineffective.).     ondansetron 4 MG tablet  Commonly known as:  ZOFRAN  Take 1 tablet (4 mg total) by mouth every 6 (six) hours as needed for nausea.     oxyCODONE 5 MG immediate release tablet  Commonly known as:  Oxy IR/ROXICODONE  Take 1-2 tablets (5-10 mg total) by mouth every 3 (three) hours as needed.     polyethylene glycol packet  Commonly known as:  MIRALAX / GLYCOLAX  Take 17 g by mouth daily as needed.     rivaroxaban 10 MG Tabs tablet  Commonly known as:  XARELTO  - Take 1 tablet (10 mg total) by mouth daily with breakfast. Take Xarelto for two and a half more weeks, then discontinue Xarelto.  - Once the patient has completed the Xarelto, they may resume the 81 mg Aspirin.     simvastatin 10 MG tablet  Commonly known as:  ZOCOR  Take 10 mg by mouth at bedtime.     traMADol 50 MG tablet  Commonly known as:  ULTRAM  Take 1-2 tablets (50-100 mg total) by mouth every 6 (six) hours as needed (mild pain).           Follow-up Information   Follow up with Loanne Drilling, MD. Schedule an appointment as soon as possible for a visit on 03/20/2013. (Call [phone] tomorrow to make the appointment)    Specialty:  Orthopedic Surgery   Contact information:   [address] [place] [address] [phone]        Signed: Patrica Duel 03/08/2013, 8:12 AM

## 2013-03-08 NOTE — Care Management Note (Signed)
    Page 1 of 1   03/08/2013     4:14:45 PM   CARE MANAGEMENT NOTE 03/08/2013  Patient:  ELIU, BATCH   Account Number:  [id number]  Date Initiated:  03/08/2013  Documentation initiated by:  Colleen Can  Subjective/Objective Assessment:   dx total left knee replacemnt     Action/Plan:   Plans are for SNF rehab   Anticipated DC Date:  03/08/2013   Anticipated DC Plan:  SKILLED NURSING FACILITY  In-house referral  Clinical Social Worker      DC Planning Services  CM consult      Choice offered to / List presented to:             Status of service:  Completed, signed off Medicare Important Message given?  NA - LOS <3 / Initial given by admissions (If response is "NO", the following Medicare IM given date fields will be blank) Date Medicare IM given:   Date Additional Medicare IM given:    Discharge Disposition:  SKILLED NURSING FACILITY  Per UR Regulation:    If discussed at Long Length of Stay Meetings, dates discussed:    Comments:

## 2013-03-09 ENCOUNTER — Non-Acute Institutional Stay (SKILLED_NURSING_FACILITY): Payer: Medicare Other | Admitting: Internal Medicine

## 2013-03-09 DIAGNOSIS — M1710 Unilateral primary osteoarthritis, unspecified knee: Secondary | ICD-10-CM

## 2013-03-09 DIAGNOSIS — E119 Type 2 diabetes mellitus without complications: Secondary | ICD-10-CM

## 2013-03-09 DIAGNOSIS — M109 Gout, unspecified: Secondary | ICD-10-CM

## 2013-03-20 ENCOUNTER — Non-Acute Institutional Stay (SKILLED_NURSING_FACILITY): Payer: Medicare Other | Admitting: Adult Health

## 2013-03-20 DIAGNOSIS — E119 Type 2 diabetes mellitus without complications: Secondary | ICD-10-CM

## 2013-03-20 DIAGNOSIS — E785 Hyperlipidemia, unspecified: Secondary | ICD-10-CM

## 2013-03-20 DIAGNOSIS — K5900 Constipation, unspecified: Secondary | ICD-10-CM

## 2013-03-20 DIAGNOSIS — I10 Essential (primary) hypertension: Secondary | ICD-10-CM

## 2013-03-20 DIAGNOSIS — M1710 Unilateral primary osteoarthritis, unspecified knee: Secondary | ICD-10-CM

## 2013-03-20 DIAGNOSIS — M179 Osteoarthritis of knee, unspecified: Secondary | ICD-10-CM

## 2013-03-20 DIAGNOSIS — N400 Enlarged prostate without lower urinary tract symptoms: Secondary | ICD-10-CM

## 2013-03-20 NOTE — Progress Notes (Signed)
Patient ID: James Hendrix, male   DOB: 22-Aug-1933, 79 y.o.   MRN: [id number]       PROGRESS NOTE  DATE: 03/20/2013   FACILITY: [facility]  LEVEL OF CARE: SNF (31)  Acute Visit  CHIEF COMPLAINT:  Discharge Notes  HISTORY OF PRESENT ILLNESS:This is a 79 year old male who is for discharge home and will schedule own outpatient therapy. He has been admitted to [facility] on 03/08/13 from [facility] with Osteoarthritis S/P Left total knee arthroplasty. Patient was admitted to this facility for short-term rehabilitation after the patient's recent hospitalization.  Patient has completed SNF rehabilitation and therapy has cleared the patient for discharge.  Reassessment of ongoing problem(s):  HTN: Pt 's HTN remains stable.  Denies CP, sob, DOE, pedal edema, headaches, dizziness or visual disturbances.  No complications from the medications currently being used.  Last BP : 132/83  BPH: The patient's BPH remains stable. Patient denies urinary hesitancy or dribbling. No complications reported from the current medications being used.  CONSTIPATION: The constipation remains stable. No complications from the medications presently being used. Patient denies ongoing constipation, abdominal pain, nausea or vomiting.  PAST MEDICAL HISTORY : Reviewed.  No changes.  CURRENT MEDICATIONS: Reviewed per [facility]  REVIEW OF SYSTEMS:  GENERAL: no change in appetite, no fatigue, no weight changes, no fever, chills or weakness RESPIRATORY: no cough, SOB, DOE, wheezing, hemoptysis CARDIAC: no chest pain, edema or palpitations GI: no abdominal pain, diarrhea, constipation, heart burn, nausea or vomiting  PHYSICAL EXAMINATION  VS:  T97.1       P93       RR20      BP132/83             WT185(Lb)  GENERAL: no acute distress, normal body habitus EYES: conjunctivae normal, sclerae normal, normal eye lids NECK: supple, trachea midline, no neck masses, no thyroid tenderness, no  thyromegaly LYMPHATICS: no LAN in the neck, no supraclavicular LAN RESPIRATORY: breathing is even & unlabored, BS CTAB CARDIAC: RRR, no murmur,no extra heart sounds, no edema GI: abdomen soft, normal BS, no masses, no tenderness, no hepatomegaly, no splenomegaly PSYCHIATRIC: the patient is alert & oriented to person, affect & behavior appropriate  LABS/RADIOLOGY: 03/12/13  Wbc 7.9  hgb 12.9  hct 39.9  NA 135  K 4.8  Glucose 144  BUN 26  Creat 1.2  CA 8.8   Uric acid 6.2    ASSESSMENT/PLAN:  Osteoarthritis S/P Left Total Knee Arthroplasty - for outpatient care  Hypertension - well-controlled; continue Lisinopril  Hyperlipidemia - continue Zocor    BPH - continue Proscar  Diabetes Mellitus - diet-controlled  Constipation - no complaints   I have filled out patient's discharge paperwork and written prescriptions.  Patient will have outpatient care.   Total discharge time: Less than 30 minutes  Discharge time involved coordination of the discharge process with Child psychotherapist, nursing staff and therapy department.    CPT CODE: 40981

## 2013-04-04 DIAGNOSIS — N183 Chronic kidney disease, stage 3 unspecified: Secondary | ICD-10-CM | POA: Insufficient documentation

## 2013-04-04 DIAGNOSIS — M109 Gout, unspecified: Secondary | ICD-10-CM | POA: Insufficient documentation

## 2013-04-04 NOTE — Progress Notes (Signed)
Patient ID: James Hendrix, male   DOB: December 23, 1933, 79 y.o.   MRN: [id number]        HISTORY & PHYSICAL  DATE: 03/09/2013   FACILITY: [facility]  LEVEL OF CARE: SNF (31)  ALLERGIES:  Allergies  Allergen Reactions  . Penicillins Other (See Comments)    dizziness   . Sulfa Antibiotics Other (See Comments)    Dizziness     CHIEF COMPLAINT:  Manage left knee osteoarthritis, diabetes mellitus, and chronic kidney disease stage III.    HISTORY OF PRESENT ILLNESS:  The patient is a 79 year-old male.    KNEE OSTEOARTHRITIS: Patient had a history of pain and functional disability in the knee due to end-stage osteoarthritis and has failed nonsurgical conservative treatments. Patient had worsening of pain with activity and weight bearing, pain that interfered with activities of daily living & pain with passive range of motion. Therefore patient underwent total knee arthroplasty and tolerated the procedure well. Patient is admitted to this facility for sort short-term rehabilitation. Patient denies knee pain.    DM:pt's DM remains stable.  Pt denies polyuria, polydipsia, polyphagia, changes in vision or hypoglycemic episodes.  No complications noted from the medication presently being used.  Last hemoglobin A1c is:   Not available.    CHRONIC KIDNEY DISEASE:   The patient has chronic kidney disease stage III.  The patient's chronic kidney disease remains stable.  Patient denies increasing lower extremity swelling or confusion. Last BUN and creatinine are:   BUN 20, creatinine 1.13.    PAST MEDICAL HISTORY :  Past Medical History  Diagnosis Date  . Hypertension   . Chronic kidney disease     ckr stage 3  . Diabetes mellitus without complication     diet  . Gout     PAST SURGICAL HISTORY: Past Surgical History  Procedure Laterality Date  . Knee arthroscopy with meniscectomy Right 2008  . Hydrocele removed  yrs ago  . Total knee arthroplasty Left 03/05/2013     Procedure: LEFT TOTAL KNEE ARTHROPLASTY;  Surgeon: Loanne Drilling, MD;  Location: [facility];  Service: Orthopedics;  Laterality: Left;    SOCIAL HISTORY:  reports that he quit smoking about 34 years ago. His smoking use included Cigarettes. He has a 7.5 pack-year smoking history. He has never used smokeless tobacco. He reports that he drinks alcohol. He reports that he does not use illicit drugs.  FAMILY HISTORY: None  CURRENT MEDICATIONS: Reviewed per MAR  REVIEW OF SYSTEMS:  See HPI otherwise 14 point ROS is negative.  PHYSICAL EXAMINATION  VS:  T 98       P 90      RR 19      BP 133/82      POX 96%        WT (Lb)  GENERAL: no acute distress, normal body habitus EYES: conjunctivae normal, sclerae normal, normal eye lids MOUTH/THROAT: lips without lesions,no lesions in the mouth,tongue is without lesions,uvula elevates in midline NECK: supple, trachea midline, no neck masses, no thyroid tenderness, no thyromegaly LYMPHATICS: no LAN in the neck, no supraclavicular LAN RESPIRATORY: breathing is even & unlabored, BS CTAB CARDIAC: RRR, no murmur,no extra heart sounds, no edema GI:  ABDOMEN: abdomen soft, normal BS, no masses, no tenderness  LIVER/SPLEEN: no hepatomegaly, no splenomegaly MUSCULOSKELETAL: HEAD: normal to inspection & palpation BACK: no kyphosis, scoliosis or spinal processes tenderness EXTREMITIES: LEFT UPPER EXTREMITY: full range of motion, normal strength & tone RIGHT  UPPER EXTREMITY:  full range of motion, normal strength & tone LEFT LOWER EXTREMITY: strength intact, range of motion not tested due to surgery, there is exquisite tenderness on the lateral side of the left foot on the dorsal side   RIGHT LOWER EXTREMITY:  full range of motion, normal strength & tone PSYCHIATRIC: the patient is alert & oriented to person, affect & behavior appropriate  LABS/RADIOLOGY: WBC 13.6, hemoglobin 14.1, MCV 89.9, platelets 107.    BMP normal.    PTT 30, PT 12.6, INR 0.96.     MRSA by PCR negative.     Staph aureus by PCR negative.    Chest x-ray:  No acute disease.    ASSESSMENT/PLAN:  Left knee osteoarthritis.  Status post left total knee arthroplasty.   Continue rehabilitation.    Diabetes mellitus.  Continue current medications.    Chronic kidney disease stage III.  Reassess renal functions.    Left foot gout.  Start colchicine 0.6 mg b.i.d. for one week.    Hypertension.  Well controlled.    Constipation.  Continue current laxatives.    Check CBC and BMP.    I have reviewed patient's medical records received at admission/from hospitalization.  CPT CODE: 16109

## 2014-11-10 IMAGING — CR DG CHEST 2V
2 series · 2 of 2 positions shown · non-contrast
Comparison: None.

CLINICAL DATA: Hypertension.

EXAM:
CHEST  2 VIEW

[w chest pa]
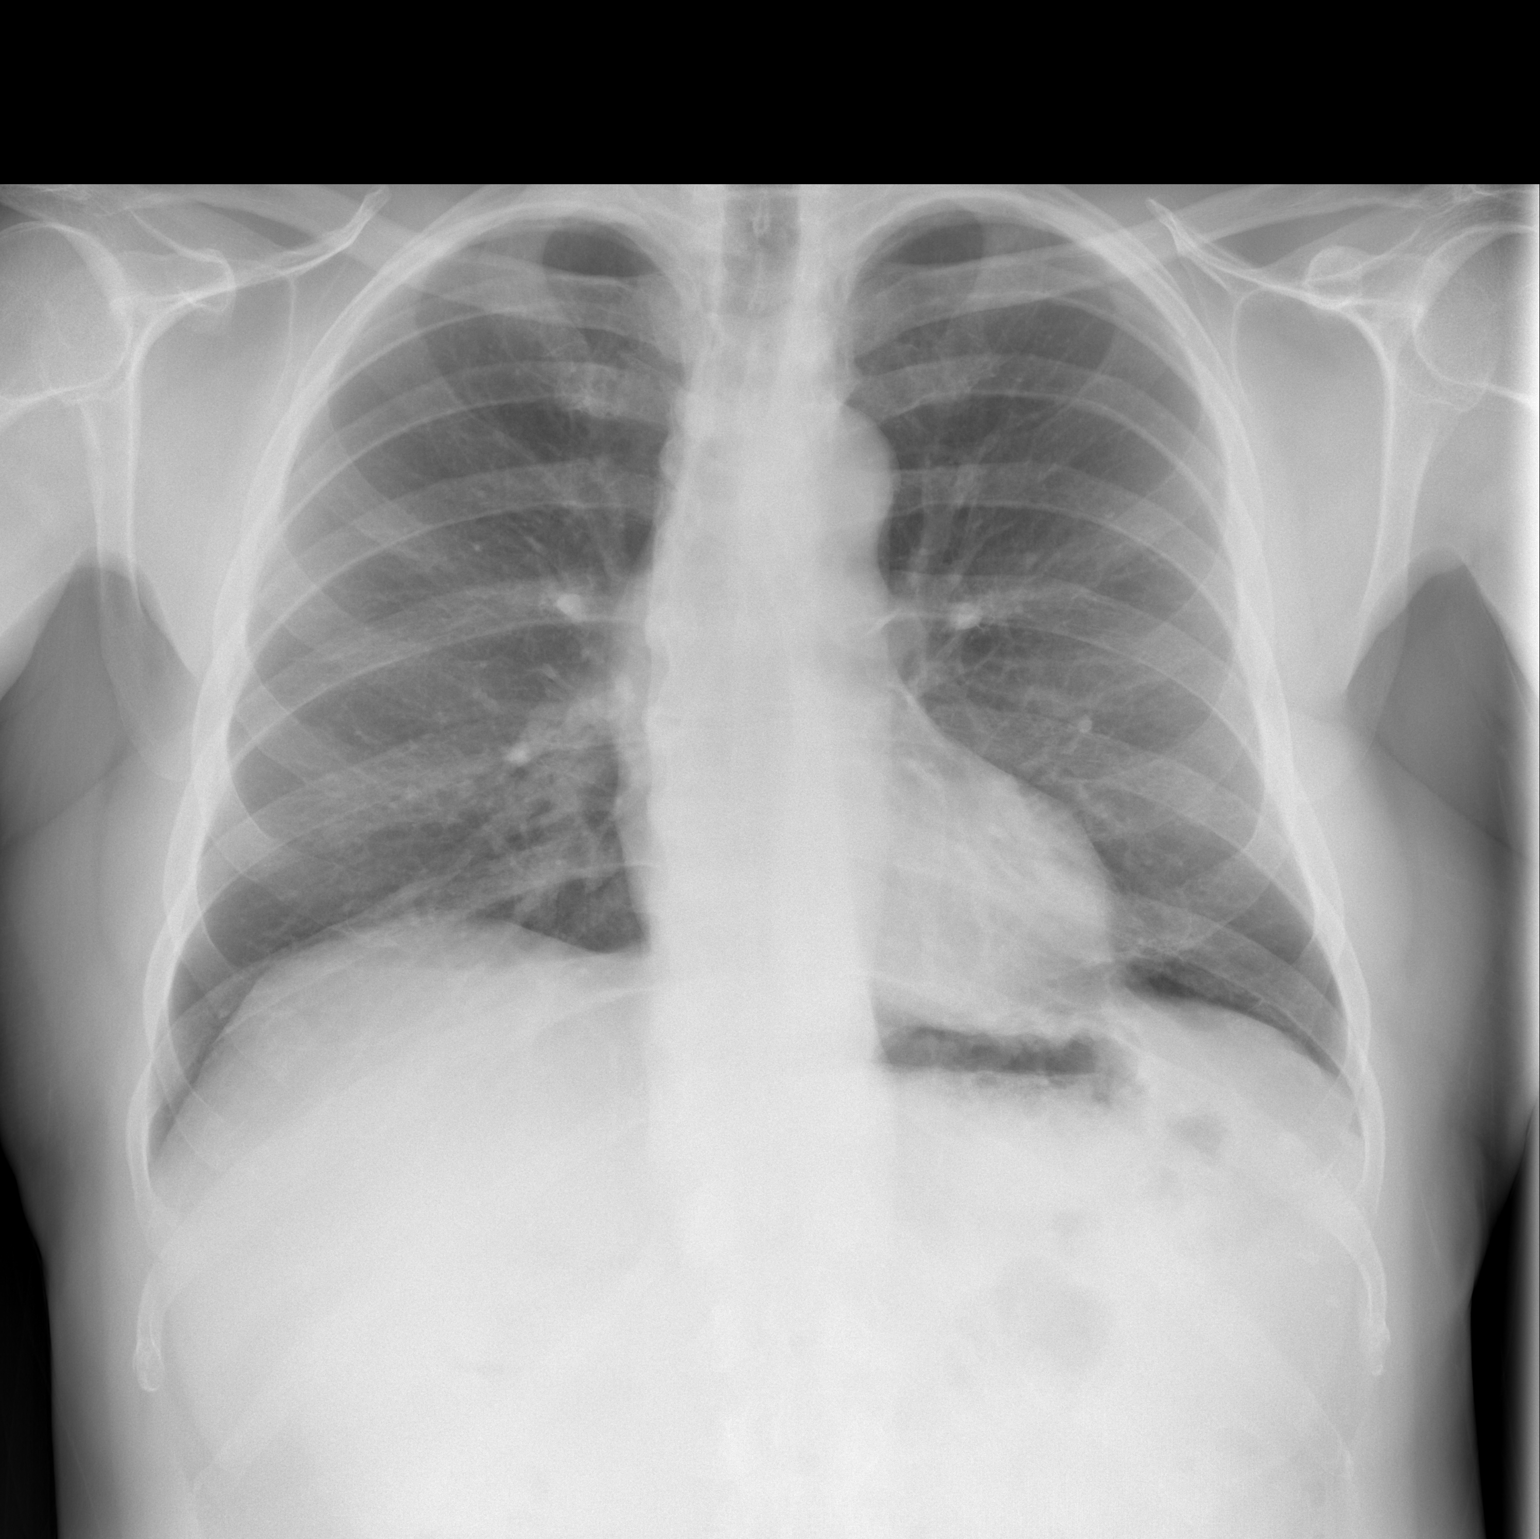

[w chest lat]
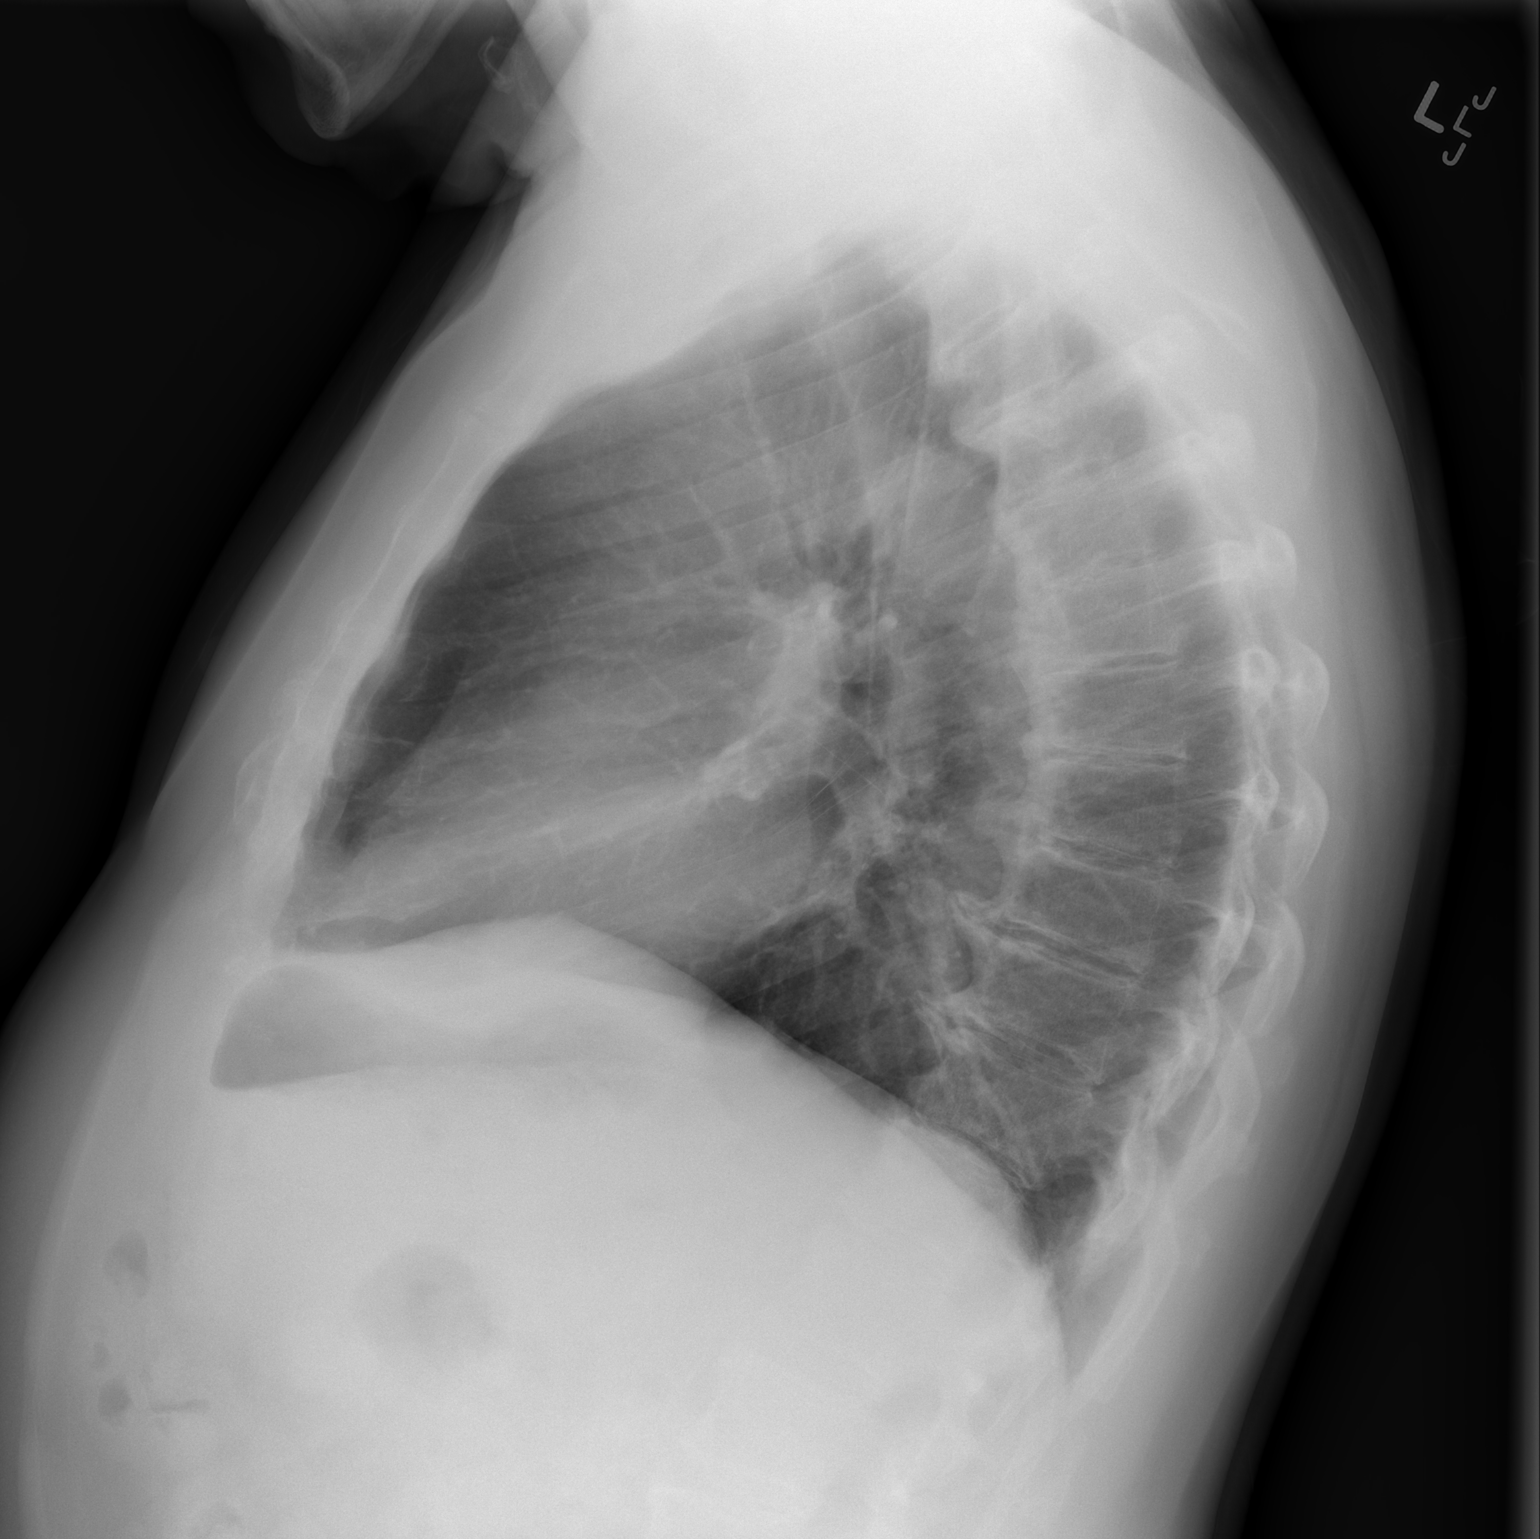

[2 of 2 positions shown; findings below may reference images not displayed]

FINDINGS: The heart size and mediastinal contours are within normal limits.
Both lungs are clear. Anterior osteophyte formation is noted
throughout the thoracic spine.
IMPRESSION: No active cardiopulmonary disease.

## 2015-04-21 ENCOUNTER — Inpatient Hospital Stay (HOSPITAL_COMMUNITY): Admit: 2015-04-21 | Payer: Medicare Other | Admitting: Orthopedic Surgery

## 2015-04-21 ENCOUNTER — Encounter (HOSPITAL_COMMUNITY): Payer: Self-pay

## 2015-04-21 SURGERY — ARTHROPLASTY, KNEE, TOTAL
Anesthesia: Choice | Laterality: Right

## 2015-06-23 ENCOUNTER — Ambulatory Visit: Payer: Self-pay | Admitting: Orthopedic Surgery

## 2015-06-23 NOTE — Progress Notes (Signed)
Preoperative surgical orders have been place into the Epic hospital system for James Hendrix on 06/23/2015, 9:37 AM  by Patrica Duel for surgery on 07-14-2015.  Preop Total Knee orders including Experal, IV Tylenol, and IV Decadron as long as there are no contraindications to the above medications. Avel Peace, PA-C

## 2015-07-03 NOTE — Patient Instructions (Addendum)
YOUR PROCEDURE IS SCHEDULED ON : 07/14/15  REPORT TO [facility] MAIN ENTRANCE FOLLOW SIGNS TO EAST ELEVATOR - GO TO 3rd FLOOR CHECK IN AT 3 EAST NURSES STATION (SHORT STAY) AT: 5:15 AM  CALL THIS NUMBER IF YOU HAVE PROBLEMS THE MORNING OF SURGERY [phone]  REMEMBER:ONLY 1 PER PERSON MAY GO TO SHORT STAY WITH YOU TO GET READY THE MORNING OF YOUR SURGERY  DO NOT EAT FOOD OR DRINK LIQUIDS AFTER MIDNIGHT  TAKE THESE MEDICINES THE MORNING OF SURGERY: FINASTERIDE / METOPROLOL / SIMVASTATIN  YOU MAY NOT HAVE ANY METAL ON YOUR BODY INCLUDING HAIR PINS AND PIERCING'S. DO NOT WEAR JEWELRY, MAKEUP, LOTIONS, POWDERS OR PERFUMES. DO NOT WEAR NAIL POLISH. DO NOT SHAVE 48 HRS PRIOR TO SURGERY. MEN MAY SHAVE FACE AND NECK.  DO NOT BRING VALUABLES TO HOSPITAL. [facility] IS NOT RESPONSIBLE FOR VALUABLES.  CONTACTS, DENTURES OR PARTIALS MAY NOT BE WORN TO SURGERY. LEAVE SUITCASE IN CAR. CAN BE BROUGHT TO ROOM AFTER SURGERY.  PATIENTS DISCHARGED THE DAY OF SURGERY WILL NOT BE ALLOWED TO DRIVE HOME.  PLEASE READ OVER THE FOLLOWING INSTRUCTION SHEETS _________________________________________________________________________________                                          [place] - PREPARING FOR SURGERY  Before surgery, you can play an important role.  Because skin is not sterile, your skin needs to be as free of germs as possible.  You can reduce the number of germs on your skin by washing with CHG (chlorahexidine gluconate) soap before surgery.  CHG is an antiseptic cleaner which kills germs and bonds with the skin to continue killing germs even after washing. Please DO NOT use if you have an allergy to CHG or antibacterial soaps.  If your skin becomes reddened/irritated stop using the CHG and inform your nurse when you arrive at Short Stay. Do not shave (including legs and underarms) for at least 48 hours prior to the first CHG shower.  You may shave your face. Please follow  these instructions carefully:   1.  Shower with CHG Soap the night before surgery and the  morning of Surgery.   2.  If you choose to wash your hair, wash your hair first as usual with your  normal  Shampoo.   3.  After you shampoo, rinse your hair and body thoroughly to remove the  shampoo.                                         4.  Use CHG as you would any other liquid soap.  You can apply chg directly  to the skin and wash . Gently wash with scrungie or clean wascloth    5.  Apply the CHG Soap to your body ONLY FROM THE NECK DOWN.   Do not use on open                           Wound or open sores. Avoid contact with eyes, ears mouth and genitals (private parts).                        Genitals (private parts) with your normal soap.  6.  Wash thoroughly, paying special attention to the area where your surgery  will be performed.   7.  Thoroughly rinse your body with warm water from the neck down.   8.  DO NOT shower/wash with your normal soap after using and rinsing off  the CHG Soap .                9.  Pat yourself dry with a clean towel.             10.  Wear clean night clothes to bed after shower             11.  Place clean sheets on your bed the night of your first shower and do not  sleep with pets.  Day of Surgery : Do not apply any lotions/deodorants the morning of surgery.  Please wear clean clothes to the hospital/surgery center.  FAILURE TO FOLLOW THESE INSTRUCTIONS MAY RESULT IN THE CANCELLATION OF YOUR SURGERY    PATIENT SIGNATURE_________________________________  ______________________________________________________________________     James Hendrix  An incentive spirometer is a tool that can help keep your lungs clear and active. This tool measures how well you are filling your lungs with each breath. Taking long deep breaths may help reverse or decrease the chance of developing breathing (pulmonary) problems (especially infection)  following:  A long period of time when you are unable to move or be active. BEFORE THE PROCEDURE   If the spirometer includes an indicator to show your best effort, your nurse or respiratory therapist will set it to a desired goal.  If possible, sit up straight or lean slightly forward. Try not to slouch.  Hold the incentive spirometer in an upright position. INSTRUCTIONS FOR USE   Sit on the edge of your bed if possible, or sit up as far as you can in bed or on a chair.  Hold the incentive spirometer in an upright position.  Breathe out normally.  Place the mouthpiece in your mouth and seal your lips tightly around it.  Breathe in slowly and as deeply as possible, raising the piston or the ball toward the top of the column.  Hold your breath for 3-5 seconds or for as long as possible. Allow the piston or ball to fall to the bottom of the column.  Remove the mouthpiece from your mouth and breathe out normally.  Rest for a few seconds and repeat Steps 1 through 7 at least 10 times every 1-2 hours when you are awake. Take your time and take a few normal breaths between deep breaths.  The spirometer may include an indicator to show your best effort. Use the indicator as a goal to work toward during each repetition.  After each set of 10 deep breaths, practice coughing to be sure your lungs are clear. If you have an incision (the cut made at the time of surgery), support your incision when coughing by placing a pillow or rolled up towels firmly against it. Once you are able to get out of bed, walk around indoors and cough well. You may stop using the incentive spirometer when instructed by your caregiver.  RISKS AND COMPLICATIONS  Take your time so you do not get dizzy or light-headed.  If you are in pain, you may need to take or ask for pain medication before doing incentive spirometry. It is harder to take a deep breath if you are having pain. AFTER USE  Rest and breathe slowly  and  easily.  It can be helpful to keep track of a log of your progress. Your caregiver can provide you with a simple table to help with this. If you are using the spirometer at home, follow these instructions: [place] IF:   You are having difficultly using the spirometer.  You have trouble using the spirometer as often as instructed.  Your pain medication is not giving enough relief while using the spirometer.  You develop fever of 100.5 F (38.1 C) or higher. SEEK IMMEDIATE MEDICAL CARE IF:   You cough up bloody sputum that had not been present before.  You develop fever of 102 F (38.9 C) or greater.  You develop worsening pain at or near the incision site. MAKE SURE YOU:   Understand these instructions.  Will watch your condition.  Will get help right away if you are not doing well or get worse. Document Released: 09/13/2006 Document Revised: 07/26/2011 Document Reviewed: 11/14/2006 ExitCare Patient Information 2014 ExitCare, [place].   ________________________________________________________________________  WHAT IS A BLOOD TRANSFUSION? Blood Transfusion Information  A transfusion is the replacement of blood or some of its parts. Blood is made up of multiple cells which provide different functions.  Red blood cells carry oxygen and are used for blood loss replacement.  White blood cells fight against infection.  Platelets control bleeding.  Plasma helps clot blood.  Other blood products are available for specialized needs, such as hemophilia or other clotting disorders. BEFORE THE TRANSFUSION  Who gives blood for transfusions?   Healthy volunteers who are fully evaluated to make sure their blood is safe. This is blood bank blood. Transfusion therapy is the safest it has ever been in the practice of medicine. Before blood is taken from a donor, a complete history is taken to make sure that person has no history of diseases nor engages in risky social  behavior (examples are intravenous drug use or sexual activity with multiple partners). The donor's travel history is screened to minimize risk of transmitting infections, such as malaria. The donated blood is tested for signs of infectious diseases, such as HIV and hepatitis. The blood is then tested to be sure it is compatible with you in order to minimize the chance of a transfusion reaction. If you or a relative donates blood, this is often done in anticipation of surgery and is not appropriate for emergency situations. It takes many days to process the donated blood. RISKS AND COMPLICATIONS Although transfusion therapy is very safe and saves many lives, the main dangers of transfusion include:   Getting an infectious disease.  Developing a transfusion reaction. This is an allergic reaction to something in the blood you were given. Every precaution is taken to prevent this. The decision to have a blood transfusion has been considered carefully by your caregiver before blood is given. Blood is not given unless the benefits outweigh the risks. AFTER THE TRANSFUSION  Right after receiving a blood transfusion, you will usually feel much better and more energetic. This is especially true if your red blood cells have gotten low (anemic). The transfusion raises the level of the red blood cells which carry oxygen, and this usually causes an energy increase.  The nurse administering the transfusion will monitor you carefully for complications. HOME CARE INSTRUCTIONS  No special instructions are needed after a transfusion. You may find your energy is better. Speak with your caregiver about any limitations on activity for underlying diseases you may have. SEEK MEDICAL CARE IF:  Your condition is not improving after your transfusion.  You develop redness or irritation at the intravenous (IV) site. SEEK IMMEDIATE MEDICAL CARE IF:  Any of the following symptoms occur over the next 12 hours:  Shaking  chills.  You have a temperature by mouth above 102 F (38.9 C), not controlled by medicine.  Chest, back, or muscle pain.  People around you feel you are not acting correctly or are confused.  Shortness of breath or difficulty breathing.  Dizziness and fainting.  You get a rash or develop hives.  You have a decrease in urine output.  Your urine turns a dark color or changes to pink, red, or brown. Any of the following symptoms occur over the next 10 days:  You have a temperature by mouth above 102 F (38.9 C), not controlled by medicine.  Shortness of breath.  Weakness after normal activity.  The white part of the eye turns yellow (jaundice).  You have a decrease in the amount of urine or are urinating less often.  Your urine turns a dark color or changes to pink, red, or brown. Document Released: 04/30/2000 Document Revised: 07/26/2011 Document Reviewed: 12/18/2007 [facility] Patient Information 2014 [place], [place].  _______________________________________________________________________

## 2015-07-07 ENCOUNTER — Encounter (HOSPITAL_COMMUNITY)
Admission: RE | Admit: 2015-07-07 | Discharge: 2015-07-07 | Disposition: A | Discharge status: Home / Self Care | Payer: Medicare Other | Source: Ambulatory Visit | Attending: Orthopedic Surgery | Admitting: Orthopedic Surgery

## 2015-07-07 ENCOUNTER — Encounter (HOSPITAL_COMMUNITY): Payer: Self-pay

## 2015-07-07 DIAGNOSIS — E119 Type 2 diabetes mellitus without complications: Secondary | ICD-10-CM | POA: Insufficient documentation

## 2015-07-07 DIAGNOSIS — Z0183 Encounter for blood typing: Secondary | ICD-10-CM | POA: Diagnosis not present

## 2015-07-07 DIAGNOSIS — M1711 Unilateral primary osteoarthritis, right knee: Secondary | ICD-10-CM | POA: Diagnosis not present

## 2015-07-07 DIAGNOSIS — Z79899 Other long term (current) drug therapy: Secondary | ICD-10-CM | POA: Diagnosis not present

## 2015-07-07 DIAGNOSIS — Z7982 Long term (current) use of aspirin: Secondary | ICD-10-CM | POA: Diagnosis not present

## 2015-07-07 DIAGNOSIS — Z7984 Long term (current) use of oral hypoglycemic drugs: Secondary | ICD-10-CM | POA: Diagnosis not present

## 2015-07-07 DIAGNOSIS — Z01812 Encounter for preprocedural laboratory examination: Secondary | ICD-10-CM | POA: Insufficient documentation

## 2015-07-07 DIAGNOSIS — I10 Essential (primary) hypertension: Secondary | ICD-10-CM | POA: Diagnosis not present

## 2015-07-07 HISTORY — DX: Renal agenesis, unilateral: Q60.0

## 2015-07-07 HISTORY — DX: Personal history of urinary calculi: Z87.442

## 2015-07-07 HISTORY — DX: Unspecified osteoarthritis, unspecified site: M19.90

## 2015-07-07 HISTORY — DX: Hyperlipidemia, unspecified: E78.5

## 2015-07-07 LAB — URINE MICROSCOPIC-ADD ON

## 2015-07-07 LAB — URINALYSIS, ROUTINE W REFLEX MICROSCOPIC
BILIRUBIN URINE: NEGATIVE
GLUCOSE, UA: NEGATIVE mg/dL
HGB URINE DIPSTICK: NEGATIVE
KETONES UR: NEGATIVE mg/dL
Nitrite: NEGATIVE
PH: 5.5 (ref 5.0–8.0)
Protein, ur: NEGATIVE mg/dL
Specific Gravity, Urine: 1.015 (ref 1.005–1.030)

## 2015-07-07 LAB — CBC
HCT: 47.3 % (ref 39.0–52.0)
Hemoglobin: 15.9 g/dL (ref 13.0–17.0)
MCH: 31.4 pg (ref 26.0–34.0)
MCHC: 33.6 g/dL (ref 30.0–36.0)
MCV: 93.3 fL (ref 78.0–100.0)
PLATELETS: 133 10*3/uL — AB (ref 150–400)
RBC: 5.07 MIL/uL (ref 4.22–5.81)
RDW: 13.4 % (ref 11.5–15.5)
WBC: 6.5 10*3/uL (ref 4.0–10.5)

## 2015-07-07 LAB — COMPREHENSIVE METABOLIC PANEL
ALT: 23 U/L (ref 17–63)
AST: 26 U/L (ref 15–41)
Albumin: 4.2 g/dL (ref 3.5–5.0)
Alkaline Phosphatase: 59 U/L (ref 38–126)
Anion gap: 7 (ref 5–15)
BUN: 19 mg/dL (ref 6–20)
CHLORIDE: 105 mmol/L (ref 101–111)
CO2: 27 mmol/L (ref 22–32)
Calcium: 9.4 mg/dL (ref 8.9–10.3)
Creatinine, Ser: 1.14 mg/dL (ref 0.61–1.24)
GFR, EST NON AFRICAN AMERICAN: 59 mL/min — AB (ref >60)
Glucose, Bld: 96 mg/dL (ref 65–99)
POTASSIUM: 4.9 mmol/L (ref 3.5–5.1)
SODIUM: 139 mmol/L (ref 135–145)
Total Bilirubin: 1 mg/dL (ref 0.3–1.2)
Total Protein: 7.3 g/dL (ref 6.5–8.1)

## 2015-07-07 LAB — APTT: aPTT: 30 seconds (ref 24–37)

## 2015-07-07 LAB — PROTIME-INR
INR: 0.96 (ref 0.00–1.49)
PROTHROMBIN TIME: 13 s (ref 11.6–15.2)

## 2015-07-07 LAB — SURGICAL PCR SCREEN
MRSA, PCR: NEGATIVE
STAPHYLOCOCCUS AUREUS: NEGATIVE

## 2015-07-08 LAB — HEMOGLOBIN A1C
Hgb A1c MFr Bld: 6.4 % — ABNORMAL HIGH (ref 4.8–5.6)
MEAN PLASMA GLUCOSE: 137 mg/dL

## 2015-07-08 NOTE — Progress Notes (Signed)
Abnormal UA faxed to Dr.Aluisio 

## 2015-07-13 ENCOUNTER — Ambulatory Visit: Payer: Self-pay | Admitting: Orthopedic Surgery

## 2015-07-13 NOTE — H&P (Signed)
James Hendrix DOB: 06/08/1933 Married / Language: [place] / Race: White Male Date of Admission: 07/14/2015 CC:  Right Knee Pain History of Present Illness The patient is a 82 year old male who comes in for a preoperative History and Physical. The patient is scheduled for a right total knee arthroplasty to be performed by Dr. Gus Rankin. Aluisio, MD at [facility] on 07-14-2015. The patient is a 82 year old male who presented for follow up of their knee. The patient is being followed for their right knee pain and osteoarthritis. Symptoms reported include: pain. The patient feels that they are doing well (occasionally bothers him) and report their pain level to be mild to moderate (varies). The patient comes in also about 2 years out from left total knee arthroplasty. The patient states that he is doing fair at this time with regards to the left knee. The pain is under fair control at this time and describe their pain as mild (lateral). They are currently on no medication for their pain. The patient feels that they are progressing well at this time. He is scheduled for the right total knee at this time. He does have pretty significant functional limitations. He still wants to proceed because of the functional limitations. He has got advanced end-stage arthritis and lot than functional limitations but from a pain standpoint, it is not severe right now. He still wants to proceed with knee replacement at this time. They have been treated conservatively in the past for the above stated problem and despite conservative measures, they continue to have progressive pain and severe functional limitations and dysfunction. They have failed non-operative management including home exercise, medications. It is felt that they would benefit from undergoing total joint replacement. Risks and benefits of the procedure have been discussed with the patient and they elect to proceed with surgery. There are no active  contraindications to surgery such as ongoing infection or rapidly progressive neurological disease.  Problem List/Past Medical  Status post total left knee replacement (Z61.096)  Diabetes Mellitus, Type II  Gout  High blood pressure  Kidney Stone  Shingles  Cataract  Varicose veins  Past History Hiatal Hernia  Hemorrhoids  Scarlet Fever  Measles  Mumps  Solitary Kidney  Urinary tract infection (N39.0)  Primary osteoarthritis of right knee (M17.11)  Allergies  No Known Drug Allergies  Intolerances Sulfa Drugs  Dizziness. Penicillins  Dizziness.  Family History Kidney disease  grandmother fathers side and child Father  Dementia Osteoarthritis  First Degree Relatives. mother, sister and brother  Social History Children  5 or more Alcohol use  current drinker; drinks beer, wine and hard liquor; only occasionally per week Tobacco / smoke exposure  no Tobacco use  Former smoker. former smoker; smoke(d) 1 pack(s) per day Post-Surgical Plans  Plan is to go to [employer] Current work status  retired Pain Contract  no Marital status  married Living situation  live with spouse Illicit drug use  no Exercise  Exercises daily; does gym / [employer] Drug/Alcohol Rehab (Previously)  no Drug/Alcohol Rehab (Currently)  no  Medication History  Vitamin D (Oral) Specific strength unknown - Active. (Every other day) MetFORMIN HCl (  Tablet, Oral) Active. Aspirin EC (  Tablet DR, Oral) Active. Metoprolol Tartrate (Oral) Specific strength unknown - Active. Finasteride (  Tablet, Oral) Active. Simvastatin (  Tablet, Oral) Active.  Past Surgical History Knee Replacement, Total 03/05/2013 Left. Arthroscopy of Knee  right Cataract Surgery  bilateral Colon Polyp Removal - Colonoscopy  Review of Systems General Present- Night Sweats. Not Present- Chills, Fatigue, Fever, Memory Loss, Weight Gain and Weight Loss. Skin Not Present-  Eczema, Hives, Itching, Lesions and Rash. HEENT Not Present- Dentures, Double Vision, Headache, Hearing Loss, Tinnitus and Visual Loss. Respiratory Not Present- Allergies, Chronic Cough, Coughing up blood, Shortness of breath at rest and Shortness of breath with exertion. Cardiovascular Not Present- Chest Pain, Difficulty Breathing Lying Down, Murmur, Palpitations, Racing/skipping heartbeats and Swelling. Gastrointestinal Not Present- Abdominal Pain, Bloody Stool, Constipation, Diarrhea, Difficulty Swallowing, Heartburn, Jaundice, Loss of appetitie, Nausea and Vomiting. Male Genitourinary Not Present- Blood in Urine, Discharge, Flank Pain, Incontinence, Painful Urination, Urgency, Urinary frequency, Urinary Retention, Urinating at Night and Weak urinary stream. Musculoskeletal Present- Joint Pain. Not Present- Back Pain, Joint Swelling, Morning Stiffness, Muscle Pain, Muscle Weakness and Spasms. Neurological Not Present- Blackout spells, Difficulty with balance, Dizziness, Paralysis, Tremor and Weakness. Psychiatric Not Present- Insomnia.  Vitals Weight: 191.4 lb Height: 68in Height was reported by patient. Body Surface Area: 2.01 m Body Mass Index: 29.1 kg/m  Pulse: 68 (Regular)  BP: 122/68 (Sitting, Left Arm, Standard)  Physical Exam General Mental Status -Alert, cooperative and good historian. General Appearance-pleasant, Not in acute distress. Orientation-Oriented X3. Build & Nutrition-Well nourished and Well developed.  Head and Neck Head-normocephalic, atraumatic . Neck Global Assessment - supple, no bruit auscultated on the right, no bruit auscultated on the left.  Eye Vision-Wears corrective lenses. Pupil - Bilateral-Regular and Round. Motion - Bilateral-EOMI.  Chest and Lung Exam Auscultation Breath sounds - clear at anterior chest wall and clear at posterior chest wall. Adventitious sounds - No Adventitious  sounds.  Cardiovascular Auscultation Rhythm - Regular rate and rhythm. Heart Sounds - S1 WNL and S2 WNL. Murmurs & Other Heart Sounds - Auscultation of the heart reveals - No Murmurs.  Abdomen Palpation/Percussion Tenderness - Abdomen is non-tender to palpation. Rigidity (guarding) - Abdomen is soft. Auscultation Auscultation of the abdomen reveals - Bowel sounds normal.  Male Genitourinary Note: Not done, not pertinent to present illness   Musculoskeletal Note: A well-developed male, in no distress. His left knee looks great. His range is 0 to 130 with no instability. He has slight tenderness around Gerdy's tubercle. He is pretty tight in his IT band when attempting to stretch it. There is no instability about his knee. His right knee range about 5 to 115, marked crepitus on range of motion, some slight tenderness medial and lateral with no instability.  His radiographs AP and lateral of both knees show the prosthesis on the left in excellent position with no periprosthetic abnormalities. On the right, he has severe bone-on-bone arthritis all three compartments with massive osteophytes and also with large loose fragment in the suprapatellar pouch. This fragment is also palpable on exam.  Assessment & Plan Status post total left knee replacement (Z61.096) Primary osteoarthritis of right knee (M17.11)  Note:Surgical Plans: Right Total Knee Replacement  Disposition: Home with Daughter  PCP: Dr. Fransisca Kaufmann - Georga Hacking,   IV TXA  Anesthesia Issues: None  Signed electronically by Beckey Rutter, III PA-C

## 2015-07-13 NOTE — Anesthesia Preprocedure Evaluation (Addendum)
Anesthesia Evaluation  Patient identified by MRN, date of birth, ID band Patient awake    Reviewed: Allergy & Precautions, H&P , NPO status , Patient's Chart, lab work & pertinent test results  Airway Mallampati: II  TM Distance: >3 FB Neck ROM: Full    Dental  (+) Dental Advisory Given, Teeth Intact   Pulmonary neg pulmonary ROS, former smoker,    Pulmonary exam normal        Cardiovascular hypertension, Pt. on medications and Pt. on home beta blockers Normal cardiovascular exam     Neuro/Psych negative neurological ROS  negative psych ROS   GI/Hepatic negative GI ROS, Neg liver ROS,   Endo/Other  diabetes, Well Controlled, Type 2, Oral Hypoglycemic Agents  Renal/GU Renal diseaseStage 3 chronic kidney disease     Musculoskeletal negative musculoskeletal ROS (+)   Abdominal   Peds  Hematology negative hematology ROS (+) Xarelto   Anesthesia Other Findings On Xarelto  Reproductive/Obstetrics negative OB ROS                            Anesthesia Physical Anesthesia Plan  ASA: III  Anesthesia Plan: Spinal   Post-op Pain Management:    Induction:   Airway Management Planned: Simple Face Mask and Natural Airway  Additional Equipment:   Intra-op Plan:   Post-operative Plan:   Informed Consent:   Plan Discussed with: Surgeon  Anesthesia Plan Comments:        Anesthesia Quick Evaluation

## 2015-07-14 ENCOUNTER — Inpatient Hospital Stay (HOSPITAL_COMMUNITY)
Admission: RE | Admit: 2015-07-14 | Discharge: 2015-07-16 | DRG: 470 | Disposition: A | Discharge status: Home Health | Payer: Medicare Other | Source: Ambulatory Visit | Attending: Orthopedic Surgery | Admitting: Orthopedic Surgery

## 2015-07-14 ENCOUNTER — Inpatient Hospital Stay (HOSPITAL_COMMUNITY): Payer: Medicare Other | Admitting: Anesthesiology

## 2015-07-14 ENCOUNTER — Encounter (HOSPITAL_COMMUNITY)
Admission: RE | Disposition: A | Discharge status: Home Health | Payer: Self-pay | Source: Ambulatory Visit | Attending: Orthopedic Surgery

## 2015-07-14 ENCOUNTER — Encounter (HOSPITAL_COMMUNITY): Payer: Self-pay | Admitting: *Deleted

## 2015-07-14 DIAGNOSIS — Z7984 Long term (current) use of oral hypoglycemic drugs: Secondary | ICD-10-CM

## 2015-07-14 DIAGNOSIS — Z01812 Encounter for preprocedural laboratory examination: Secondary | ICD-10-CM

## 2015-07-14 DIAGNOSIS — Z87891 Personal history of nicotine dependence: Secondary | ICD-10-CM

## 2015-07-14 DIAGNOSIS — M1711 Unilateral primary osteoarthritis, right knee: Secondary | ICD-10-CM

## 2015-07-14 DIAGNOSIS — Z7982 Long term (current) use of aspirin: Secondary | ICD-10-CM

## 2015-07-14 DIAGNOSIS — N183 Chronic kidney disease, stage 3 (moderate): Secondary | ICD-10-CM | POA: Diagnosis present

## 2015-07-14 DIAGNOSIS — Z79899 Other long term (current) drug therapy: Secondary | ICD-10-CM | POA: Diagnosis not present

## 2015-07-14 DIAGNOSIS — M25561 Pain in right knee: Secondary | ICD-10-CM | POA: Diagnosis present

## 2015-07-14 DIAGNOSIS — Z96652 Presence of left artificial knee joint: Secondary | ICD-10-CM | POA: Diagnosis present

## 2015-07-14 DIAGNOSIS — M179 Osteoarthritis of knee, unspecified: Secondary | ICD-10-CM | POA: Diagnosis present

## 2015-07-14 DIAGNOSIS — M1710 Unilateral primary osteoarthritis, unspecified knee: Secondary | ICD-10-CM | POA: Diagnosis present

## 2015-07-14 DIAGNOSIS — I129 Hypertensive chronic kidney disease with stage 1 through stage 4 chronic kidney disease, or unspecified chronic kidney disease: Secondary | ICD-10-CM | POA: Diagnosis present

## 2015-07-14 DIAGNOSIS — E1122 Type 2 diabetes mellitus with diabetic chronic kidney disease: Secondary | ICD-10-CM | POA: Diagnosis present

## 2015-07-14 HISTORY — PX: TOTAL KNEE ARTHROPLASTY: SHX125

## 2015-07-14 LAB — GLUCOSE, CAPILLARY
GLUCOSE-CAPILLARY: 156 mg/dL — AB (ref 65–99)
Glucose-Capillary: 176 mg/dL — ABNORMAL HIGH (ref 65–99)
Glucose-Capillary: 192 mg/dL — ABNORMAL HIGH (ref 65–99)
Glucose-Capillary: 196 mg/dL — ABNORMAL HIGH (ref 65–99)
Glucose-Capillary: 195 mg/dL — ABNORMAL HIGH (ref 65–99)

## 2015-07-14 LAB — TYPE AND SCREEN
ABO/RH(D): O POS
ANTIBODY SCREEN: NEGATIVE

## 2015-07-14 SURGERY — ARTHROPLASTY, KNEE, TOTAL
Anesthesia: Spinal | Site: Knee | Laterality: Right

## 2015-07-14 MED ORDER — ONDANSETRON HCL 4 MG/2ML IJ SOLN
INTRAMUSCULAR | Status: DC | PRN
Start: 2015-07-14 — End: 2015-07-14
  Administered 2015-07-14: 4 mg via INTRAVENOUS

## 2015-07-14 MED ORDER — LIDOCAINE HCL (CARDIAC) 20 MG/ML IV SOLN
INTRAVENOUS | Status: DC | PRN
  Administered 2015-07-14: 100 mg via INTRAVENOUS

## 2015-07-14 MED ORDER — HYDROMORPHONE HCL 1 MG/ML IJ SOLN
INTRAMUSCULAR | Status: DC | PRN
  Administered 2015-07-14 (×2): 0.5 mg via INTRAVENOUS

## 2015-07-14 MED ORDER — DIPHENHYDRAMINE HCL 12.5 MG/5ML PO ELIX: 12.5000 mg | ORAL_SOLUTION | ORAL | Status: DC | PRN

## 2015-07-14 MED ORDER — PROPOFOL 10 MG/ML IV BOLUS
INTRAVENOUS | Status: DC | PRN
  Administered 2015-07-14: 130 mg via INTRAVENOUS

## 2015-07-14 MED ORDER — OXYCODONE HCL 5 MG PO TABS
5.0000 mg | ORAL_TABLET | ORAL | Status: DC | PRN
  Administered 2015-07-14: 5 mg via ORAL
  Administered 2015-07-14: 10 mg via ORAL
  Administered 2015-07-14: 5 mg via ORAL
  Administered 2015-07-15 (×2): 10 mg via ORAL
  Administered 2015-07-15: 5 mg via ORAL
  Administered 2015-07-16 (×4): 10 mg via ORAL
  Filled 2015-07-14: qty 2
  Filled 2015-07-14: qty 1
  Filled 2015-07-14 (×5): qty 2
  Filled 2015-07-14: qty 1
  Filled 2015-07-14 (×2): qty 2

## 2015-07-14 MED ORDER — PROPOFOL 500 MG/50ML IV EMUL
INTRAVENOUS | Status: DC | PRN
  Administered 2015-07-14: 25 ug/kg/min via INTRAVENOUS

## 2015-07-14 MED ORDER — CEFAZOLIN SODIUM-DEXTROSE 2-3 GM-% IV SOLR
INTRAVENOUS | Status: AC
  Filled 2015-07-14: qty 50

## 2015-07-14 MED ORDER — FENTANYL CITRATE (PF) 100 MCG/2ML IJ SOLN
INTRAMUSCULAR | Status: AC
  Filled 2015-07-14: qty 2

## 2015-07-14 MED ORDER — BUPIVACAINE HCL 0.25 % IJ SOLN
INTRAMUSCULAR | Status: DC | PRN
  Administered 2015-07-14: 20 mL

## 2015-07-14 MED ORDER — ACETAMINOPHEN 650 MG RE SUPP: 650.0000 mg | Freq: Four times a day (QID) | RECTAL | Status: DC | PRN

## 2015-07-14 MED ORDER — ROCURONIUM BROMIDE 100 MG/10ML IV SOLN
INTRAVENOUS | Status: AC
  Filled 2015-07-14: qty 1

## 2015-07-14 MED ORDER — TRANEXAMIC ACID 1000 MG/10ML IV SOLN
1000.0000 mg | INTRAVENOUS | Status: AC
  Administered 2015-07-14: 1000 mg via INTRAVENOUS
  Administered 2015-07-14: 07:00:00 via INTRAVENOUS
  Filled 2015-07-14: qty 10

## 2015-07-14 MED ORDER — METOCLOPRAMIDE HCL 5 MG/ML IJ SOLN: 5.0000 mg | Freq: Three times a day (TID) | INTRAMUSCULAR | Status: DC | PRN

## 2015-07-14 MED ORDER — SIMVASTATIN 10 MG PO TABS
10.0000 mg | ORAL_TABLET | Freq: Every day | ORAL | Status: DC
  Administered 2015-07-14 – 2015-07-15 (×2): 10 mg via ORAL
  Filled 2015-07-14 (×3): qty 1

## 2015-07-14 MED ORDER — FENTANYL CITRATE (PF) 100 MCG/2ML IJ SOLN
INTRAMUSCULAR | Status: DC | PRN
  Administered 2015-07-14 (×4): 50 ug via INTRAVENOUS

## 2015-07-14 MED ORDER — SODIUM CHLORIDE 0.9 % IV SOLN: INTRAVENOUS | Status: DC

## 2015-07-14 MED ORDER — TRAMADOL HCL 50 MG PO TABS
50.0000 mg | ORAL_TABLET | Freq: Four times a day (QID) | ORAL | Status: DC | PRN
  Administered 2015-07-15 – 2015-07-16 (×2): 100 mg via ORAL
  Filled 2015-07-14 (×2): qty 2

## 2015-07-14 MED ORDER — MIDAZOLAM HCL 2 MG/2ML IJ SOLN
INTRAMUSCULAR | Status: AC
  Filled 2015-07-14: qty 2

## 2015-07-14 MED ORDER — CHLORHEXIDINE GLUCONATE 4 % EX LIQD: 60.0000 mL | Freq: Once | CUTANEOUS | Status: DC

## 2015-07-14 MED ORDER — BUPIVACAINE HCL (PF) 0.25 % IJ SOLN
INTRAMUSCULAR | Status: AC
  Filled 2015-07-14: qty 30

## 2015-07-14 MED ORDER — ACETAMINOPHEN 10 MG/ML IV SOLN
INTRAVENOUS | Status: AC
  Filled 2015-07-14: qty 100

## 2015-07-14 MED ORDER — LIDOCAINE HCL (CARDIAC) 20 MG/ML IV SOLN
INTRAVENOUS | Status: AC
  Filled 2015-07-14: qty 5

## 2015-07-14 MED ORDER — ACETAMINOPHEN 10 MG/ML IV SOLN
1000.0000 mg | Freq: Once | INTRAVENOUS | Status: AC
  Administered 2015-07-14: 1000 mg via INTRAVENOUS

## 2015-07-14 MED ORDER — CEFAZOLIN SODIUM-DEXTROSE 2-3 GM-% IV SOLR
2.0000 g | Freq: Four times a day (QID) | INTRAVENOUS | Status: AC
  Administered 2015-07-14 (×2): 2 g via INTRAVENOUS
  Filled 2015-07-14 (×2): qty 50

## 2015-07-14 MED ORDER — SODIUM CHLORIDE 0.9 % IV SOLN
INTRAVENOUS | Status: DC
  Administered 2015-07-14 (×2): via INTRAVENOUS

## 2015-07-14 MED ORDER — TRANEXAMIC ACID 1000 MG/10ML IV SOLN
1000.0000 mg | Freq: Once | INTRAVENOUS | Status: AC
  Administered 2015-07-14: 1000 mg via INTRAVENOUS
  Filled 2015-07-14: qty 10

## 2015-07-14 MED ORDER — ACETAMINOPHEN 500 MG PO TABS
1000.0000 mg | ORAL_TABLET | Freq: Four times a day (QID) | ORAL | Status: AC
  Administered 2015-07-14 – 2015-07-15 (×4): 1000 mg via ORAL
  Filled 2015-07-14 (×4): qty 2

## 2015-07-14 MED ORDER — MIDAZOLAM HCL 5 MG/5ML IJ SOLN
INTRAMUSCULAR | Status: DC | PRN
  Administered 2015-07-14: 2 mg via INTRAVENOUS

## 2015-07-14 MED ORDER — DEXAMETHASONE SODIUM PHOSPHATE 10 MG/ML IJ SOLN
INTRAMUSCULAR | Status: AC
  Filled 2015-07-14: qty 1

## 2015-07-14 MED ORDER — EPHEDRINE SULFATE 50 MG/ML IJ SOLN
INTRAMUSCULAR | Status: DC | PRN
  Administered 2015-07-14 (×2): 5 mg via INTRAVENOUS

## 2015-07-14 MED ORDER — LACTATED RINGERS IV SOLN
INTRAVENOUS | Status: DC | PRN
  Administered 2015-07-14: 07:00:00 via INTRAVENOUS

## 2015-07-14 MED ORDER — FENTANYL CITRATE (PF) 100 MCG/2ML IJ SOLN: 25.0000 ug | INTRAMUSCULAR | Status: DC | PRN

## 2015-07-14 MED ORDER — METHOCARBAMOL 1000 MG/10ML IJ SOLN
500.0000 mg | Freq: Four times a day (QID) | INTRAVENOUS | Status: DC | PRN
  Administered 2015-07-14: 500 mg via INTRAVENOUS
  Filled 2015-07-14 (×2): qty 5

## 2015-07-14 MED ORDER — DEXAMETHASONE SODIUM PHOSPHATE 10 MG/ML IJ SOLN
10.0000 mg | Freq: Once | INTRAMUSCULAR | Status: AC
  Administered 2015-07-15: 10 mg via INTRAVENOUS
  Filled 2015-07-14: qty 1

## 2015-07-14 MED ORDER — METOPROLOL TARTRATE 25 MG PO TABS
25.0000 mg | ORAL_TABLET | Freq: Two times a day (BID) | ORAL | Status: DC
  Administered 2015-07-14 – 2015-07-16 (×4): 25 mg via ORAL
  Filled 2015-07-14 (×5): qty 1

## 2015-07-14 MED ORDER — INSULIN ASPART 100 UNIT/ML ~~LOC~~ SOLN: 0.0000 [IU] | Freq: Three times a day (TID) | SUBCUTANEOUS | Status: DC

## 2015-07-14 MED ORDER — SODIUM CHLORIDE 0.9 % IJ SOLN
INTRAMUSCULAR | Status: AC
  Filled 2015-07-14: qty 10

## 2015-07-14 MED ORDER — CEFAZOLIN SODIUM-DEXTROSE 2-3 GM-% IV SOLR
2.0000 g | INTRAVENOUS | Status: AC
Start: 2015-07-14 — End: 2015-07-14
  Administered 2015-07-14: 2 g via INTRAVENOUS

## 2015-07-14 MED ORDER — PHENOL 1.4 % MT LIQD
1.0000 | OROMUCOSAL | Status: DC | PRN
Start: 2015-07-14 — End: 2015-07-16

## 2015-07-14 MED ORDER — METFORMIN HCL 500 MG PO TABS
500.0000 mg | ORAL_TABLET | Freq: Two times a day (BID) | ORAL | Status: DC
Start: 2015-07-15 — End: 2015-07-15
  Filled 2015-07-14 (×3): qty 1

## 2015-07-14 MED ORDER — BUPIVACAINE LIPOSOME 1.3 % IJ SUSP
INTRAMUSCULAR | Status: DC | PRN
  Administered 2015-07-14: 20 mL

## 2015-07-14 MED ORDER — SUCCINYLCHOLINE CHLORIDE 20 MG/ML IJ SOLN
INTRAMUSCULAR | Status: DC | PRN
  Administered 2015-07-14: 100 mg via INTRAVENOUS

## 2015-07-14 MED ORDER — ROCURONIUM BROMIDE 100 MG/10ML IV SOLN
INTRAVENOUS | Status: DC | PRN
  Administered 2015-07-14: 20 mg via INTRAVENOUS

## 2015-07-14 MED ORDER — METOCLOPRAMIDE HCL 5 MG PO TABS
5.0000 mg | ORAL_TABLET | Freq: Three times a day (TID) | ORAL | Status: DC | PRN
  Filled 2015-07-14: qty 2

## 2015-07-14 MED ORDER — ONDANSETRON HCL 4 MG PO TABS: 4.0000 mg | ORAL_TABLET | Freq: Four times a day (QID) | ORAL | Status: DC | PRN

## 2015-07-14 MED ORDER — BUPIVACAINE LIPOSOME 1.3 % IJ SUSP
20.0000 mL | Freq: Once | INTRAMUSCULAR | Status: DC
  Filled 2015-07-14: qty 20

## 2015-07-14 MED ORDER — DOCUSATE SODIUM 100 MG PO CAPS
100.0000 mg | ORAL_CAPSULE | Freq: Two times a day (BID) | ORAL | Status: DC
  Administered 2015-07-14 – 2015-07-16 (×4): 100 mg via ORAL

## 2015-07-14 MED ORDER — MORPHINE SULFATE (PF) 2 MG/ML IV SOLN: 1.0000 mg | INTRAVENOUS | Status: DC | PRN

## 2015-07-14 MED ORDER — HYDROMORPHONE HCL 2 MG/ML IJ SOLN
INTRAMUSCULAR | Status: AC
  Filled 2015-07-14: qty 1

## 2015-07-14 MED ORDER — DEXAMETHASONE SODIUM PHOSPHATE 10 MG/ML IJ SOLN
10.0000 mg | Freq: Once | INTRAMUSCULAR | Status: AC
  Administered 2015-07-14: 10 mg via INTRAVENOUS

## 2015-07-14 MED ORDER — EPHEDRINE SULFATE 50 MG/ML IJ SOLN
INTRAMUSCULAR | Status: AC
  Filled 2015-07-14: qty 1

## 2015-07-14 MED ORDER — FLEET ENEMA 7-19 GM/118ML RE ENEM: 1.0000 | ENEMA | Freq: Once | RECTAL | Status: DC | PRN

## 2015-07-14 MED ORDER — PROPOFOL 10 MG/ML IV BOLUS
INTRAVENOUS | Status: AC
  Filled 2015-07-14: qty 40

## 2015-07-14 MED ORDER — RIVAROXABAN 10 MG PO TABS
10.0000 mg | ORAL_TABLET | Freq: Every day | ORAL | Status: DC
  Administered 2015-07-15 – 2015-07-16 (×2): 10 mg via ORAL
  Filled 2015-07-14 (×3): qty 1

## 2015-07-14 MED ORDER — METHOCARBAMOL 500 MG PO TABS
500.0000 mg | ORAL_TABLET | Freq: Four times a day (QID) | ORAL | Status: DC | PRN
  Administered 2015-07-14 – 2015-07-16 (×5): 500 mg via ORAL
  Filled 2015-07-14 (×5): qty 1

## 2015-07-14 MED ORDER — HYDROMORPHONE HCL 1 MG/ML IJ SOLN
INTRAMUSCULAR | Status: AC
  Filled 2015-07-14: qty 1

## 2015-07-14 MED ORDER — ONDANSETRON HCL 4 MG/2ML IJ SOLN: 4.0000 mg | Freq: Four times a day (QID) | INTRAMUSCULAR | Status: DC | PRN

## 2015-07-14 MED ORDER — HYDROMORPHONE HCL 1 MG/ML IJ SOLN
0.5000 mg | INTRAMUSCULAR | Status: DC | PRN
  Administered 2015-07-14: 0.5 mg via INTRAVENOUS

## 2015-07-14 MED ORDER — BISACODYL 10 MG RE SUPP: 10.0000 mg | Freq: Every day | RECTAL | Status: DC | PRN

## 2015-07-14 MED ORDER — MENTHOL 3 MG MT LOZG: 1.0000 | LOZENGE | OROMUCOSAL | Status: DC | PRN

## 2015-07-14 MED ORDER — MORPHINE SULFATE (PF) 10 MG/ML IV SOLN: 1.0000 mg | INTRAVENOUS | Status: DC | PRN

## 2015-07-14 MED ORDER — ACETAMINOPHEN 325 MG PO TABS: 650.0000 mg | ORAL_TABLET | Freq: Four times a day (QID) | ORAL | Status: DC | PRN

## 2015-07-14 MED ORDER — SODIUM CHLORIDE 0.9 % IJ SOLN
INTRAMUSCULAR | Status: DC | PRN
  Administered 2015-07-14: 30 mL

## 2015-07-14 MED ORDER — FINASTERIDE 5 MG PO TABS
5.0000 mg | ORAL_TABLET | Freq: Every day | ORAL | Status: DC
  Administered 2015-07-14 – 2015-07-16 (×3): 5 mg via ORAL
  Filled 2015-07-14 (×3): qty 1

## 2015-07-14 MED ORDER — LACTATED RINGERS IV SOLN
INTRAVENOUS | Status: DC
  Administered 2015-07-14: 1000 mL via INTRAVENOUS

## 2015-07-14 MED ORDER — SUGAMMADEX SODIUM 200 MG/2ML IV SOLN
INTRAVENOUS | Status: DC | PRN
  Administered 2015-07-14: 200 mg via INTRAVENOUS

## 2015-07-14 MED ORDER — SODIUM CHLORIDE 0.9 % IJ SOLN
INTRAMUSCULAR | Status: AC
  Filled 2015-07-14: qty 50

## 2015-07-14 MED ORDER — POLYETHYLENE GLYCOL 3350 17 G PO PACK
17.0000 g | PACK | Freq: Every day | ORAL | Status: DC | PRN
Start: 2015-07-14 — End: 2015-07-16

## 2015-07-14 SURGICAL SUPPLY — 52 items
BAG DECANTER FOR FLEXI CONT (MISCELLANEOUS) ×3 IMPLANT
BAG ZIPLOCK 12X15 (MISCELLANEOUS) ×3 IMPLANT
BANDAGE ACE 6X5 VEL STRL LF (GAUZE/BANDAGES/DRESSINGS) IMPLANT
BANDAGE ELASTIC 6 VELCRO ST LF (GAUZE/BANDAGES/DRESSINGS) ×3 IMPLANT
BLADE SAG 18X100X1.27 (BLADE) ×3 IMPLANT
BLADE SAW SGTL 11.0X1.19X90.0M (BLADE) ×3 IMPLANT
BOWL SMART MIX CTS (DISPOSABLE) ×3 IMPLANT
CAP KNEE TOTAL 3 SIGMA ×3 IMPLANT
CEMENT HV SMART SET (Cement) ×6 IMPLANT
CLOSURE WOUND 1/2 X4 (GAUZE/BANDAGES/DRESSINGS) ×1
CLOTH BEACON ORANGE TIMEOUT ST (SAFETY) ×3 IMPLANT
CUFF TOURN SGL QUICK 34 (TOURNIQUET CUFF) ×2
CUFF TRNQT CYL 34X4X40X1 (TOURNIQUET CUFF) ×1 IMPLANT
DECANTER SPIKE VIAL GLASS SM (MISCELLANEOUS) ×3 IMPLANT
DRAPE U-SHAPE 47X51 STRL (DRAPES) ×3 IMPLANT
DRSG ADAPTIC 3X8 NADH LF (GAUZE/BANDAGES/DRESSINGS) ×3 IMPLANT
DRSG PAD ABDOMINAL 8X10 ST (GAUZE/BANDAGES/DRESSINGS) IMPLANT
DURAPREP 26ML APPLICATOR (WOUND CARE) ×3 IMPLANT
ELECT REM PT RETURN 9FT ADLT (ELECTROSURGICAL) ×3
ELECTRODE REM PT RTRN 9FT ADLT (ELECTROSURGICAL) ×1 IMPLANT
EVACUATOR 1/8 PVC DRAIN (DRAIN) ×3 IMPLANT
GAUZE SPONGE 4X4 12PLY STRL (GAUZE/BANDAGES/DRESSINGS) ×3 IMPLANT
GLOVE BIO SURGEON STRL SZ7.5 (GLOVE) IMPLANT
GLOVE BIO SURGEON STRL SZ8 (GLOVE) ×3 IMPLANT
GLOVE BIOGEL PI IND STRL 6.5 (GLOVE) IMPLANT
GLOVE BIOGEL PI IND STRL 8 (GLOVE) ×1 IMPLANT
GLOVE BIOGEL PI INDICATOR 6.5 (GLOVE)
GLOVE BIOGEL PI INDICATOR 8 (GLOVE) ×2
GLOVE SURG SS PI 6.5 STRL IVOR (GLOVE) IMPLANT
GOWN STRL REUS W/TWL LRG LVL3 (GOWN DISPOSABLE) ×3 IMPLANT
GOWN STRL REUS W/TWL XL LVL3 (GOWN DISPOSABLE) IMPLANT
HANDPIECE INTERPULSE COAX TIP (DISPOSABLE) ×2
IMMOBILIZER KNEE 20 (SOFTGOODS) ×3 IMPLANT
IMMOBILIZER KNEE 20 THIGH 36 (SOFTGOODS) IMPLANT
MANIFOLD NEPTUNE II (INSTRUMENTS) ×3 IMPLANT
NS IRRIG 1000ML POUR BTL (IV SOLUTION) ×3 IMPLANT
PACK TOTAL KNEE CUSTOM (KITS) ×3 IMPLANT
PAD ABD 8X10 STRL (GAUZE/BANDAGES/DRESSINGS) ×3 IMPLANT
PADDING CAST COTTON 6X4 STRL (CAST SUPPLIES) ×3 IMPLANT
POSITIONER SURGICAL ARM (MISCELLANEOUS) ×3 IMPLANT
SET HNDPC FAN SPRY TIP SCT (DISPOSABLE) ×1 IMPLANT
STRIP CLOSURE SKIN 1/2X4 (GAUZE/BANDAGES/DRESSINGS) ×2 IMPLANT
SUT MNCRL AB 4-0 PS2 18 (SUTURE) ×3 IMPLANT
SUT VIC AB 2-0 CT1 27 (SUTURE) ×6
SUT VIC AB 2-0 CT1 TAPERPNT 27 (SUTURE) ×3 IMPLANT
SUT VLOC 180 0 24IN GS25 (SUTURE) ×3 IMPLANT
SYR 50ML LL SCALE MARK (SYRINGE) ×3 IMPLANT
TRAY FOLEY W/METER SILVER 14FR (SET/KITS/TRAYS/PACK) IMPLANT
TRAY FOLEY W/METER SILVER 16FR (SET/KITS/TRAYS/PACK) ×3 IMPLANT
WATER STERILE IRR 1500ML POUR (IV SOLUTION) ×3 IMPLANT
WRAP KNEE MAXI GEL POST OP (GAUZE/BANDAGES/DRESSINGS) ×3 IMPLANT
YANKAUER SUCT BULB TIP 10FT TU (MISCELLANEOUS) ×3 IMPLANT

## 2015-07-14 NOTE — Transfer of Care (Signed)
Immediate Anesthesia Transfer of Care Note  Patient: James Hendrix  Procedure(s) Performed: Procedure(s): TOTAL KNEE ARTHROPLASTY (Right)  Patient Location: PACU  Anesthesia Type:General  Level of Consciousness: awake, alert , oriented and patient cooperative  Airway & Oxygen Therapy: Patient Spontanous Breathing and Patient connected to face mask oxygen  Post-op Assessment: Report given to RN, Post -op Vital signs reviewed and stable and Patient moving all extremities  Post vital signs: Reviewed and stable  Last Vitals:  Filed Vitals:   07/14/15 0528  BP: 175/84  Pulse: 55  Temp: 36.6 C  Resp: 18    Complications: No apparent anesthesia complications

## 2015-07-14 NOTE — Op Note (Signed)
Pre-operative diagnosis- Osteoarthritis  Right knee(s)  Post-operative diagnosis- Osteoarthritis Right knee(s)  Procedure-  Right  Total Knee Arthroplasty  Surgeon- Gus Rankin. Hang Ammon, MD  Assistant- Dimitri Ped, PA-C   Anesthesia-  General  EBL-* No blood loss amount entered *   Drains Hemovac  Tourniquet time-  Total Tourniquet Time Documented: Thigh (Right) - 35 minutes Total: Thigh (Right) - 35 minutes     Complications- None  Condition-PACU - hemodynamically stable.   Brief Clinical Note  James Hendrix is a 82 y.o. year old male with end stage OA of his right knee with progressively worsening pain and dysfunction. He has constant pain, with activity and at rest and significant functional deficits with difficulties even with ADLs. He has had extensive non-op management including analgesics, injections of cortisone and viscosupplements, and home exercise program, but remains in significant pain with significant dysfunction. Radiographs show tricompartmental bone on bone changes. He presents now for rightt Total Knee Arthroplasty.    Procedure in detail---   The patient is brought into the operating room and positioned supine on the operating table. After successful administration of  General,   a tourniquet is placed high on the  Right thigh(s) and the lower extremity is prepped and draped in the usual sterile fashion. Time out is performed by the operating team and then the  Right lower extremity is wrapped in Esmarch, knee flexed and the tourniquet inflated to 300 mmHg.       A midline incision is made with a ten blade through the subcutaneous tissue to the level of the extensor mechanism. A fresh blade is used to make a medial parapatellar arthrotomy. Soft tissue over the proximal medial tibia is subperiosteally elevated to the joint line with a knife and into the semimembranosus bursa with a Cobb elevator. Soft tissue over the proximal lateral tibia is elevated with  attention being paid to avoiding the patellar tendon on the tibial tubercle. The patella is everted, knee flexed 90 degrees and the ACL and PCL are removed. Findings are bone on bone all 3 compartments with massive global osteophytes.        The drill is used to create a starting hole in the distal femur and the canal is thoroughly irrigated with sterile saline to remove the fatty contents. The 5 degree Right  valgus alignment guide is placed into the femoral canal and the distal femoral cutting block is pinned to remove 10 mm off the distal femur. Resection is made with an oscillating saw.      The tibia is subluxed forward and the menisci are removed. The extramedullary alignment guide is placed referencing proximally at the medial aspect of the tibial tubercle and distally along the second metatarsal axis and tibial crest. The block is pinned to remove 2mm off the more deficient medial  side. Resection is made with an oscillating saw. Size 5is the most appropriate size for the tibia and the proximal tibia is prepared with the modular drill and keel punch for that size.      The femoral sizing guide is placed and size 4 is most appropriate. Rotation is marked off the epicondylar axis and confirmed by creating a rectangular flexion gap at 90 degrees. The size 4 cutting block is pinned in this rotation and the anterior, posterior and chamfer cuts are made with the oscillating saw. The intercondylar block is then placed and that cut is made.      Trial size 5 tibial component, trial size  4 posterior stabilized femur and a 12.5  mm posterior stabilized rotating platform insert trial is placed. Full extension is achieved with excellent varus/valgus and anterior/posterior balance throughout full range of motion. The patella is everted and thickness measured to be 27  mm. Free hand resection is taken to 15 mm, a 41 template is placed, lug holes are drilled, trial patella is placed, and it tracks normally. Osteophytes  are removed off the posterior femur with the trial in place. All trials are removed and the cut bone surfaces prepared with pulsatile lavage. Cement is mixed and once ready for implantation, the size 5 tibial implant, size  4 posterior stabilized femoral component, and the size 41 patella are cemented in place and the patella is held with the clamp. The trial insert is placed and the knee held in full extension. The Exparel (20 ml mixed with 30 ml saline) and .25% Bupivicaine, are injected into the extensor mechanism, posterior capsule, medial and lateral gutters and subcutaneous tissues.  All extruded cement is removed and once the cement is hard the permanent 12.5 mm posterior stabilized rotating platform insert is placed into the tibial tray.      The wound is copiously irrigated with saline solution and the extensor mechanism closed over a hemovac drain with #1 V-loc suture. The tourniquet is released for a total tourniquet time of 35  minutes. Flexion against gravity is 140 degrees and the patella tracks normally. Subcutaneous tissue is closed with 2.0 vicryl and subcuticular with running 4.0 Monocryl. The incision is cleaned and dried and steri-strips and a bulky sterile dressing are applied. The limb is placed into a knee immobilizer and the patient is awakened and transported to recovery in stable condition.      Please note that a surgical assistant was a medical necessity for this procedure in order to perform it in a safe and expeditious manner. Surgical assistant was necessary to retract the ligaments and vital neurovascular structures to prevent injury to them and also necessary for proper positioning of the limb to allow for anatomic placement of the prosthesis.   Gus Rankin Denis Carreon, MD    07/14/2015, 8:12 AM

## 2015-07-14 NOTE — Anesthesia Postprocedure Evaluation (Signed)
Anesthesia Post Note  Patient: James Hendrix  Procedure(s) Performed: Procedure(s) (LRB): TOTAL KNEE ARTHROPLASTY (Right)  Patient location during evaluation: PACU Anesthesia Type: General Level of consciousness: awake and alert Pain management: pain level controlled Vital Signs Assessment: post-procedure vital signs reviewed and stable Respiratory status: spontaneous breathing, nonlabored ventilation, respiratory function stable and patient connected to nasal cannula oxygen Cardiovascular status: blood pressure returned to baseline and stable Postop Assessment: no signs of nausea or vomiting Anesthetic complications: no    Last Vitals:  Filed Vitals:   07/14/15 0930 07/14/15 0945  BP: 135/77 136/69  Pulse: 56 57  Temp:  36.6 C  Resp: 13 20    Last Pain:  Filed Vitals:   07/14/15 0950  PainSc: Asleep                 Nozomi Mettler L

## 2015-07-14 NOTE — Progress Notes (Signed)
Pt with recent gout attack (states about 1 week ago ) right great toe, swollen,pink, pt on indocin about 1 week.  Dr Lequita Halt notified, no new orders

## 2015-07-14 NOTE — H&P (View-Only) (Signed)
James Hendrix DOB: 09/28/1933 Married / Language: English / Race: White Male Date of Admission: 07/14/2015 CC:  Right Knee Pain History of Present Illness The patient is an 82 year old male who comes in for a preoperative History and Physical. The patient is scheduled for a right total knee arthroplasty to be performed by Dr. Frank V. Aluisio, MD at [facility] on 07-14-2015. The patient is an 82 year old male who presented for follow up of their knee. The patient is being followed for their right knee pain and osteoarthritis. Symptoms reported include: pain. The patient feels that they are doing well (occasionally bothers him) and report their pain level to be mild to moderate (varies). The patient comes in also about 2 years out from left total knee arthroplasty. The patient states that he is doing fair at this time with regards to the left knee. The pain is under fair control at this time and describe their pain as mild (lateral). They are currently on no medication for their pain. The patient feels that they are progressing well at this time. He is scheduled for the right total knee at this time. He does have pretty significant functional limitations. He still wants to proceed because of the functional limitations. He has got advanced end-stage arthritis and lot than functional limitations but from a pain standpoint, it is not severe right now. He still wants to proceed with knee replacement at this time. They have been treated conservatively in the past for the above stated problem and despite conservative measures, they continue to have progressive pain and severe functional limitations and dysfunction. They have failed non-operative management including home exercise, medications. It is felt that they would benefit from undergoing total joint replacement. Risks and benefits of the procedure have been discussed with the patient and they elect to proceed with surgery. There are no active  contraindications to surgery such as ongoing infection or rapidly progressive neurological disease.  Problem List/Past Medical  Status post total left knee replacement (Z96.652)  Diabetes Mellitus, Type II  Gout  High blood pressure  Kidney Stone  Shingles  Cataract  Varicose veins  Past History Hiatal Hernia  Hemorrhoids  Scarlet Fever  Measles  Mumps  Solitary Kidney  Urinary tract infection (N39.0)  Primary osteoarthritis of right knee (M17.11)  Allergies  No Known Drug Allergies  Intolerances Sulfa Drugs  Dizziness. Penicillins  Dizziness.  Family History Kidney disease  grandmother fathers side and child Father  Dementia Osteoarthritis  First Degree Relatives. mother, sister and brother  Social History Children  5 or more Alcohol use  current drinker; drinks beer, wine and hard liquor; only occasionally per week Tobacco / smoke exposure  no Tobacco use  Former smoker. former smoker; smoke(d) 1 pack(s) per day Post-Surgical Plans  Plan is to go to [place] Current work status  retired Pain Contract  no Marital status  married Living situation  live with spouse Illicit drug use  no Exercise  Exercises daily; does gym / weights Drug/Alcohol Rehab (Previously)  no Drug/Alcohol Rehab (Currently)  no  Medication History  Vitamin D (Oral) Specific strength unknown - Active. (Every other day) MetFORMIN HCl (500MG Tablet, Oral) Active. Aspirin EC (81MG Tablet DR, Oral) Active. Metoprolol Tartrate (Oral) Specific strength unknown - Active. Finasteride (5MG Tablet, Oral) Active. Simvastatin (10MG Tablet, Oral) Active.  Past Surgical History Knee Replacement, Total 03/05/2013 Left. Arthroscopy of Knee  right Cataract Surgery  bilateral Colon Polyp Removal - Colonoscopy      Review of Systems General Present- Night Sweats. Not Present- Chills, Fatigue, Fever, Memory Loss, Weight Gain and Weight Loss. Skin Not Present-  Eczema, Hives, Itching, Lesions and Rash. HEENT Not Present- Dentures, Double Vision, Headache, Hearing Loss, Tinnitus and Visual Loss. Respiratory Not Present- Allergies, Chronic Cough, Coughing up blood, Shortness of breath at rest and Shortness of breath with exertion. Cardiovascular Not Present- Chest Pain, Difficulty Breathing Lying Down, Murmur, Palpitations, Racing/skipping heartbeats and Swelling. Gastrointestinal Not Present- Abdominal Pain, Bloody Stool, Constipation, Diarrhea, Difficulty Swallowing, Heartburn, Jaundice, Loss of appetitie, Nausea and Vomiting. Male Genitourinary Not Present- Blood in Urine, Discharge, Flank Pain, Incontinence, Painful Urination, Urgency, Urinary frequency, Urinary Retention, Urinating at Night and Weak urinary stream. Musculoskeletal Present- Joint Pain. Not Present- Back Pain, Joint Swelling, Morning Stiffness, Muscle Pain, Muscle Weakness and Spasms. Neurological Not Present- Blackout spells, Difficulty with balance, Dizziness, Paralysis, Tremor and Weakness. Psychiatric Not Present- Insomnia.  Vitals Weight: 191.4 lb Height: 68in Height was reported by patient. Body Surface Area: 2.01 m Body Mass Index: 29.1 kg/m  Pulse: 68 (Regular)  BP: 122/68 (Sitting, Left Arm, Standard)  Physical Exam General Mental Status -Alert, cooperative and good historian. General Appearance-pleasant, Not in acute distress. Orientation-Oriented X3. Build & Nutrition-Well nourished and Well developed.  Head and Neck Head-normocephalic, atraumatic . Neck Global Assessment - supple, no bruit auscultated on the right, no bruit auscultated on the left.  Eye Vision-Wears corrective lenses. Pupil - Bilateral-Regular and Round. Motion - Bilateral-EOMI.  Chest and Lung Exam Auscultation Breath sounds - clear at anterior chest wall and clear at posterior chest wall. Adventitious sounds - No Adventitious  sounds.  Cardiovascular Auscultation Rhythm - Regular rate and rhythm. Heart Sounds - S1 WNL and S2 WNL. Murmurs & Other Heart Sounds - Auscultation of the heart reveals - No Murmurs.  Abdomen Palpation/Percussion Tenderness - Abdomen is non-tender to palpation. Rigidity (guarding) - Abdomen is soft. Auscultation Auscultation of the abdomen reveals - Bowel sounds normal.  Male Genitourinary Note: Not done, not pertinent to present illness   Musculoskeletal Note: A well-developed male, in no distress. His left knee looks great. His range is 0 to 130 with no instability. He has slight tenderness around Gerdy's tubercle. He is pretty tight in his IT band when attempting to stretch it. There is no instability about his knee. His right knee range about 5 to 115, marked crepitus on range of motion, some slight tenderness medial and lateral with no instability.  His radiographs AP and lateral of both knees show the prosthesis on the left in excellent position with no periprosthetic abnormalities. On the right, he has severe bone-on-bone arthritis all three compartments with massive osteophytes and also with large loose fragment in the suprapatellar pouch. This fragment is also palpable on exam.  Assessment & Plan Status post total left knee replacement (Z61.096) Primary osteoarthritis of right knee (M17.11)  Note:Surgical Plans: Right Total Knee Replacement  Disposition: Home with Daughter  PCP: Dr. Fransisca Kaufmann - Georga Hacking,   IV TXA  Anesthesia Issues: None  Signed electronically by Beckey Rutter, III PA-C

## 2015-07-14 NOTE — Interval H&P Note (Signed)
History and Physical Interval Note:  07/14/2015 6:41 AM  James Hendrix  has presented today for surgery, with the diagnosis of OA RIGHT KNEE  The various methods of treatment have been discussed with the patient and family. After consideration of risks, benefits and other options for treatment, the patient has consented to  Procedure(s): TOTAL KNEE ARTHROPLASTY (Right) as a surgical intervention .  The patient's history has been reviewed, patient examined, no change in status, stable for surgery.  I have reviewed the patient's chart and labs.  Questions were answered to the patient's satisfaction.     Loanne Drilling

## 2015-07-14 NOTE — Progress Notes (Signed)
Utilization review completed.  

## 2015-07-14 NOTE — Anesthesia Procedure Notes (Signed)
Procedure Name: Intubation Date/Time: 07/14/2015 7:21 AM Performed by: Jarvis Newcomer A Pre-anesthesia Checklist: Patient identified, Timeout performed, Emergency Drugs available, Suction available and Patient being monitored Patient Re-evaluated:Patient Re-evaluated prior to inductionOxygen Delivery Method: Circle system utilized Preoxygenation: Pre-oxygenation with 100% oxygen Intubation Type: IV induction Ventilation: Mask ventilation without difficulty Laryngoscope Size: Mac and 4 Grade View: Grade I Tube type: Oral Tube size: 7.5 mm Number of attempts: 1 Airway Equipment and Method: Stylet Placement Confirmation: breath sounds checked- equal and bilateral,  ETT inserted through vocal cords under direct vision and positive ETCO2 Secured at: 22 cm Tube secured with: Tape Dental Injury: Teeth and Oropharynx as per pre-operative assessment

## 2015-07-14 NOTE — Evaluation (Signed)
Physical Therapy Evaluation Patient Details Name: James Hendrix MRN: [id number] DOB: 06-02-1933 Today's Date: 07/14/2015   History of Present Illness  s/p R TKA;  PMHx: L TKA  Clinical Impression  Pt is s/p TKA resulting in the deficits listed below (see PT Problem List).  Pt will benefit from skilled PT to increase their independence and safety with mobility to allow discharge to the venue listed below.  Pt very sleepy this pm but agreeable to PT/OOB; agree with plan for HHPT; will follow     Follow Up Recommendations Home health PT    Equipment Recommendations   (pt unsure ?has RW)    Recommendations for Other Services       Precautions / Restrictions Precautions Precautions: Knee Restrictions Weight Bearing Restrictions: No Other Position/Activity Restrictions: WBAT      Mobility  Bed Mobility Overal bed mobility: Needs Assistance Bed Mobility: Supine to Sit     Supine to sit: Min assist     General bed mobility comments: assist with RLE  Transfers Overall transfer level: Needs assistance Equipment used: Rolling walker (2 wheeled) Transfers: Sit to/from Stand Sit to Stand: Min assist         General transfer comment: cues for hand placement and RLE position  Ambulation/Gait Ambulation/Gait assistance: Min assist Ambulation Distance (Feet): 4 Feet (pivotal steps to chair) Assistive device: Rolling walker (2 wheeled) Gait Pattern/deviations: Step-to pattern;Antalgic     General Gait Details: cues for sequence,  RW position  Stairs            Wheelchair Mobility    Modified Rankin (Stroke Patients Only)       Balance                                             Pertinent Vitals/Pain Pain Assessment: 0-10 Pain Score: 4  Pain Location: thigh  Pain Descriptors / Indicators: Sore Pain Intervention(s): Limited activity within patient's tolerance;Monitored during session;Premedicated before session;Repositioned     Home Living Family/patient expects to be discharged to:: Private residence Living Arrangements: Spouse/significant other;Children Available Help at Discharge: Family Type of Home: House   Entrance Stairs-Rails: None Entrance Stairs-Number of Steps: 1 Home Layout: Two level;Bed/bath upstairs;1/2 bath on main level Home Equipment: Cane - single point;Crutches;Walker - 2 wheels      Prior Function Level of Independence: Independent               Hand Dominance        Extremity/Trunk Assessment               Lower Extremity Assessment: RLE deficits/detail RLE Deficits / Details: hip flexion and knee extension 3/5; ankle  WFL       Communication   Communication: No difficulties  Cognition Arousal/Alertness: Awake/alert (sleepy) Behavior During Therapy: WFL for tasks assessed/performed Overall Cognitive Status: Within Functional Limits for tasks assessed                      General Comments      Exercises Total Joint Exercises Ankle Circles/Pumps: AROM;Both;10 reps Quad Sets: 10 reps;AROM;Strengthening;Both      Assessment/Plan    PT Assessment Patient needs continued PT services  PT Diagnosis Difficulty walking   PT Problem List Decreased strength;Decreased range of motion;Decreased mobility;Decreased knowledge of use of DME  PT Treatment Interventions DME instruction;Gait training;Functional mobility training;Therapeutic activities;Patient/family  education;Therapeutic exercise   PT Goals (Current goals can be found in the Care Plan section) Acute Rehab PT Goals Patient Stated Goal: home PT Goal Formulation: With patient Time For Goal Achievement: 07/21/15 Potential to Achieve Goals: Good    Frequency 7X/week   Barriers to discharge        Co-evaluation               End of Session Equipment Utilized During Treatment: Gait belt Activity Tolerance: Patient tolerated treatment well Patient left: in chair;with call bell/phone  within reach;with chair alarm set;with family/visitor present           Time: 1434-1500 PT Time Calculation (min) (ACUTE ONLY): 26 min   Charges:   PT Evaluation $PT Eval Low Complexity: 1 Procedure PT Treatments $Therapeutic Activity: 8-22 mins   PT G Codes:        James Hendrix Aug 02, 2015, 3:07 PM

## 2015-07-15 LAB — CBC
HCT: 36.5 % — ABNORMAL LOW (ref 39.0–52.0)
Hemoglobin: 12.7 g/dL — ABNORMAL LOW (ref 13.0–17.0)
MCH: 31.6 pg (ref 26.0–34.0)
MCHC: 34.8 g/dL (ref 30.0–36.0)
MCV: 90.8 fL (ref 78.0–100.0)
PLATELETS: 107 10*3/uL — AB (ref 150–400)
RBC: 4.02 MIL/uL — ABNORMAL LOW (ref 4.22–5.81)
RDW: 13.1 % (ref 11.5–15.5)
WBC: 12.3 10*3/uL — ABNORMAL HIGH (ref 4.0–10.5)

## 2015-07-15 LAB — BASIC METABOLIC PANEL
Anion gap: 9 (ref 5–15)
BUN: 20 mg/dL (ref 6–20)
CALCIUM: 8.2 mg/dL — AB (ref 8.9–10.3)
CO2: 23 mmol/L (ref 22–32)
CREATININE: 1.14 mg/dL (ref 0.61–1.24)
Chloride: 104 mmol/L (ref 101–111)
GFR calc Af Amer: >60 mL/min (ref >60)
GFR, EST NON AFRICAN AMERICAN: 59 mL/min — AB (ref >60)
GLUCOSE: 190 mg/dL — AB (ref 65–99)
Potassium: 4.6 mmol/L (ref 3.5–5.1)
Sodium: 136 mmol/L (ref 135–145)

## 2015-07-15 LAB — GLUCOSE, CAPILLARY
Glucose-Capillary: 135 mg/dL — ABNORMAL HIGH (ref 65–99)
Glucose-Capillary: 152 mg/dL — ABNORMAL HIGH (ref 65–99)
Glucose-Capillary: 153 mg/dL — ABNORMAL HIGH (ref 65–99)

## 2015-07-15 MED ORDER — OXYCODONE HCL 5 MG PO TABS: 5.0000 mg | ORAL_TABLET | ORAL | Status: DC | PRN

## 2015-07-15 MED ORDER — TRAMADOL HCL 50 MG PO TABS: 50.0000 mg | ORAL_TABLET | Freq: Four times a day (QID) | ORAL | Status: DC | PRN

## 2015-07-15 MED ORDER — METHOCARBAMOL 500 MG PO TABS: 500.0000 mg | ORAL_TABLET | Freq: Four times a day (QID) | ORAL | Status: DC | PRN

## 2015-07-15 MED ORDER — RIVAROXABAN 10 MG PO TABS: 10.0000 mg | ORAL_TABLET | Freq: Every day | ORAL | Status: DC

## 2015-07-15 NOTE — Progress Notes (Signed)
Delivered rw to hospital room

## 2015-07-15 NOTE — Discharge Summary (Signed)
Physician Discharge Summary   Patient ID: James Hendrix MRN: [id number] DOB/AGE: [birth year] 82 y.o.  Admit date: 07/14/2015 Discharge date: 07/16/2015  Primary Diagnosis:  Osteoarthritis Right knee(s) Admission Diagnoses:  Past Medical History  Diagnosis Date  . Hypertension   . Chronic kidney disease     ckr stage 3  . Diabetes mellitus without complication (HCC)     diet  . Gout   . Hyperlipidemia   . Arthritis   . Congenital absence of one kidney   . History of kidney stones    Discharge Diagnoses:   Principal Problem:   OA (osteoarthritis) of knee  Estimated body mass index is 28.59 kg/(m^2) as calculated from the following:   Height as of this encounter: 5' 8"  (1.727 m).   Weight as of this encounter: 85.276 kg (188 lb).  Procedure:  Procedure(s) (LRB): TOTAL KNEE ARTHROPLASTY (Right)   Consults: None  HPI: James Hendrix is a 82 y.o. year old year old male with end stage OA of his right knee with progressively worsening pain and dysfunction. He has constant pain, with activity and at rest and significant functional deficits with difficulties even with ADLs. He has had extensive non-op management including analgesics, injections of cortisone and viscosupplements, and home exercise program, but remains in significant pain with significant dysfunction. Radiographs show tricompartmental bone on bone changes. He presents now for rightt Total Knee Arthroplasty.  Laboratory Data: Admission on 07/14/2015, Discharged on 07/16/2015  Component Date Value Ref Range Status  . Glucose-Capillary 07/14/2015 176* 65 - 99 mg/dL Final  . Glucose-Capillary 07/14/2015 156* 65 - 99 mg/dL Final  . Comment 1 07/14/2015 Document in Chart   Final  . Glucose-Capillary 07/14/2015 192* 65 - 99 mg/dL Final  . WBC 07/15/2015 12.3* 4.0 - 10.5 K/uL Final  . RBC 07/15/2015 4.02* 4.22 - 5.81 MIL/uL Final  . Hemoglobin 07/15/2015 12.7* 13.0 - 17.0 g/dL Final  . HCT 07/15/2015 36.5* 39.0 - 52.0 % Final    . MCV 07/15/2015 90.8  78.0 - 100.0 fL Final  . MCH 07/15/2015 31.6  26.0 - 34.0 pg Final  . MCHC 07/15/2015 34.8  30.0 - 36.0 g/dL Final  . RDW 07/15/2015 13.1  11.5 - 15.5 % Final  . Platelets 07/15/2015 107* 150 - 400 K/uL Final   PLATELET COUNT CONFIRMED BY SMEAR  . Sodium 07/15/2015 136  135 - 145 mmol/L Final  . Potassium 07/15/2015 4.6  3.5 - 5.1 mmol/L Final  . Chloride 07/15/2015 104  101 - 111 mmol/L Final  . CO2 07/15/2015 23  22 - 32 mmol/L Final  . Glucose, Bld 07/15/2015 190* 65 - 99 mg/dL Final  . BUN 07/15/2015 20  6 - 20 mg/dL Final  . Creatinine, Ser 07/15/2015 1.14  0.61 - 1.24 mg/dL Final  . Calcium 07/15/2015 8.2* 8.9 - 10.3 mg/dL Final  . GFR calc non Af Amer 07/15/2015 59* >60 mL/min Final  . GFR calc Af Amer 07/15/2015 >60  >60 mL/min Final   Comment: (NOTE) The eGFR has been calculated using the CKD EPI equation. This calculation has not been validated in all clinical situations. eGFR's persistently <60 mL/min signify possible Chronic Kidney Disease.   . Anion gap 07/15/2015 9  5 - 15 Final  . Glucose-Capillary 07/14/2015 196* 65 - 99 mg/dL Final  . Glucose-Capillary 07/14/2015 195* 65 - 99 mg/dL Final  . Glucose-Capillary 07/15/2015 135* 65 - 99 mg/dL Final  . Glucose-Capillary 07/15/2015 152* 65 - 99 mg/dL Final  . WBC  07/16/2015 10.2  4.0 - 10.5 K/uL Final  . RBC 07/16/2015 3.88* 4.22 - 5.81 MIL/uL Final  . Hemoglobin 07/16/2015 12.2* 13.0 - 17.0 g/dL Final  . HCT 07/16/2015 35.9* 39.0 - 52.0 % Final  . MCV 07/16/2015 92.5  78.0 - 100.0 fL Final  . MCH 07/16/2015 31.4  26.0 - 34.0 pg Final  . MCHC 07/16/2015 34.0  30.0 - 36.0 g/dL Final  . RDW 07/16/2015 13.7  11.5 - 15.5 % Final  . Platelets 07/16/2015 110* 150 - 400 K/uL Final   CONSISTENT WITH PREVIOUS RESULT  . Sodium 07/16/2015 137  135 - 145 mmol/L Final  . Potassium 07/16/2015 4.6  3.5 - 5.1 mmol/L Final  . Chloride 07/16/2015 104  101 - 111 mmol/L Final  . CO2 07/16/2015 27  22 - 32 mmol/L  Final  . Glucose, Bld 07/16/2015 142* 65 - 99 mg/dL Final  . BUN 07/16/2015 22* 6 - 20 mg/dL Final  . Creatinine, Ser 07/16/2015 1.24  0.61 - 1.24 mg/dL Final  . Calcium 07/16/2015 8.3* 8.9 - 10.3 mg/dL Final  . GFR calc non Af Amer 07/16/2015 54* >60 mL/min Final  . GFR calc Af Amer 07/16/2015 >60  >60 mL/min Final   Comment: (NOTE) The eGFR has been calculated using the CKD EPI equation. This calculation has not been validated in all clinical situations. eGFR's persistently <60 mL/min signify possible Chronic Kidney Disease.   . Anion gap 07/16/2015 6  5 - 15 Final  . Glucose-Capillary 07/15/2015 153* 65 - 99 mg/dL Final  . Glucose-Capillary 07/16/2015 161* 65 - 99 mg/dL Final  . Glucose-Capillary 07/16/2015 123* 65 - 99 mg/dL Final  . Glucose-Capillary 07/16/2015 151* 65 - 99 mg/dL Final  Hospital Outpatient Visit on 07/07/2015  Component Date Value Ref Range Status  . MRSA, PCR 07/07/2015 NEGATIVE  NEGATIVE Final  . Staphylococcus aureus 07/07/2015 NEGATIVE  NEGATIVE Final   Comment:        The Xpert SA Assay (FDA approved for NASAL specimens in patients over 62 years of age), is one component of a comprehensive surveillance program.  Test performance has been validated by [facility] for patients greater than or equal to 25 year old. It is not intended to diagnose infection nor to guide or monitor treatment.   Marland Kitchen aPTT 07/07/2015 30  24 - 37 seconds Final  . WBC 07/07/2015 6.5  4.0 - 10.5 K/uL Final  . RBC 07/07/2015 5.07  4.22 - 5.81 MIL/uL Final  . Hemoglobin 07/07/2015 15.9  13.0 - 17.0 g/dL Final  . HCT 07/07/2015 47.3  39.0 - 52.0 % Final  . MCV 07/07/2015 93.3  78.0 - 100.0 fL Final  . MCH 07/07/2015 31.4  26.0 - 34.0 pg Final  . MCHC 07/07/2015 33.6  30.0 - 36.0 g/dL Final  . RDW 07/07/2015 13.4  11.5 - 15.5 % Final  . Platelets 07/07/2015 133* 150 - 400 K/uL Final  . Sodium 07/07/2015 139  135 - 145 mmol/L Final  . Potassium 07/07/2015 4.9  3.5 - 5.1 mmol/L  Final  . Chloride 07/07/2015 105  101 - 111 mmol/L Final  . CO2 07/07/2015 27  22 - 32 mmol/L Final  . Glucose, Bld 07/07/2015 96  65 - 99 mg/dL Final  . BUN 07/07/2015 19  6 - 20 mg/dL Final  . Creatinine, Ser 07/07/2015 1.14  0.61 - 1.24 mg/dL Final  . Calcium 07/07/2015 9.4  8.9 - 10.3 mg/dL Final  . Total Protein 07/07/2015 7.3  6.5 - 8.1 g/dL Final  . Albumin 07/07/2015 4.2  3.5 - 5.0 g/dL Final  . AST 07/07/2015 26  15 - 41 U/L Final  . ALT 07/07/2015 23  17 - 63 U/L Final  . Alkaline Phosphatase 07/07/2015 59  38 - 126 U/L Final  . Total Bilirubin 07/07/2015 1.0  0.3 - 1.2 mg/dL Final  . GFR calc non Af Amer 07/07/2015 59* >60 mL/min Final  . GFR calc Af Amer 07/07/2015 >60  >60 mL/min Final   Comment: (NOTE) The eGFR has been calculated using the CKD EPI equation. This calculation has not been validated in all clinical situations. eGFR's persistently <60 mL/min signify possible Chronic Kidney Disease.   . Anion gap 07/07/2015 7  5 - 15 Final  . Prothrombin Time 07/07/2015 13.0  11.6 - 15.2 seconds Final  . INR 07/07/2015 0.96  0.00 - 1.49 Final  . ABO/RH(D) 07/07/2015 O POS   Final  . Antibody Screen 07/07/2015 NEG   Final  . Sample Expiration 07/07/2015 07/17/2015   Final  . Extend sample reason 07/07/2015 NO TRANSFUSIONS OR PREGNANCY IN THE PAST 3 MONTHS   Final  . Color, Urine 07/07/2015 YELLOW  YELLOW Final  . APPearance 07/07/2015 CLEAR  CLEAR Final  . Specific Gravity, Urine 07/07/2015 1.015  1.005 - 1.030 Final  . pH 07/07/2015 5.5  5.0 - 8.0 Final  . Glucose, UA 07/07/2015 NEGATIVE  NEGATIVE mg/dL Final  . Hgb urine dipstick 07/07/2015 NEGATIVE  NEGATIVE Final  . Bilirubin Urine 07/07/2015 NEGATIVE  NEGATIVE Final  . Ketones, ur 07/07/2015 NEGATIVE  NEGATIVE mg/dL Final  . Protein, ur 07/07/2015 NEGATIVE  NEGATIVE mg/dL Final  . Nitrite 07/07/2015 NEGATIVE  NEGATIVE Final  . Leukocytes, UA 07/07/2015 SMALL* NEGATIVE Final  . Hgb A1c MFr Bld 07/07/2015 6.4* 4.8 -  5.6 % Final   Comment: (NOTE)         Pre-diabetes: 5.7 - 6.4         Diabetes: >6.4         Glycemic control for adults with diabetes: <7.0   . Mean Plasma Glucose 07/07/2015 137   Final   Comment: (NOTE) Performed At: [facility] [place], [place] [id number] Lindon Romp MD OQ:[id number]   . Squamous Epithelial / LPF 07/07/2015 0-5* NONE SEEN Final  . WBC, UA 07/07/2015 6-30  0 - 5 WBC/hpf Final  . RBC / HPF 07/07/2015 0-5  0 - 5 RBC/hpf Final  . Bacteria, UA 07/07/2015 FEW* NONE SEEN Final     X-Rays:No results found.  EKG:No orders found for this or any previous visit.   Hospital Course: James Hendrix is a 82 y.o. who was admitted to [facility]. They were brought to the operating room on 07/14/2015 and underwent Procedure(s): TOTAL KNEE ARTHROPLASTY.  Patient tolerated the procedure well and was later transferred to the recovery room and then to the orthopaedic floor for postoperative care.  They were given PO and IV analgesics for pain control following their surgery.  They were given 24 hours of postoperative antibiotics of  Anti-infectives    Start     Dose/Rate Route Frequency Ordered Stop   07/14/15 1400  ceFAZolin (ANCEF) IVPB 2 g/50 mL premix     2 g 100 mL/hr over 30 Minutes Intravenous Every 6 hours 07/14/15 1009 07/14/15 2029   07/14/15 0542  ceFAZolin (ANCEF) IVPB 2 g/50 mL premix     2 g 100 mL/hr over 30 Minutes  Intravenous On call to O.R. 07/14/15 8638 07/14/15 0724     and started on DVT prophylaxis in the form of Xarelto.   PT and OT were ordered for total joint protocol.  Discharge planning consulted to help with postop disposition and equipment needs.  Patient had a good night on the evening of surgery.  They started to get up OOB with therapy on day one. Hemovac drain was pulled without difficulty.  Continued to work with therapy into day two.  Dressing was changed on day two and the incision was healing well. Patient was  seen in rounds and was ready to go home.  Discharge home with home health Diet - Cardiac diet, Diabetic diet and Renal diet Follow up - in 2 weeks Activity - WBAT Disposition - Home Condition Upon Discharge - Good D/C Meds - See DC Summary DVT Prophylaxis - Xarelto      Discharge Instructions    Call MD / Call 911    Complete by:  As directed   If you experience chest pain or shortness of breath, CALL 911 and be transported to the hospital emergency room.  If you develope a fever above 101 F, pus (white drainage) or increased drainage or redness at the wound, or calf pain, call your surgeon's office.     Change dressing    Complete by:  As directed   Change dressing daily with sterile 4 x 4 inch gauze dressing and apply TED hose. Do not submerge the incision under water.     Constipation Prevention    Complete by:  As directed   Drink plenty of fluids.  Prune juice may be helpful.  You may use a stool softener, such as Colace (over the counter) 100 mg twice a day.  Use MiraLax (over the counter) for constipation as needed.     Diet - low sodium heart healthy    Complete by:  As directed      Diet Carb Modified    Complete by:  As directed      Discharge instructions    Complete by:  As directed   Pick up stool softner and laxative for home use following surgery while on pain medications. Do not submerge incision under water. Please use good hand washing techniques while changing dressing each day. May shower starting three days after surgery. Please use a clean towel to pat the incision dry following showers. Continue to use ice for pain and swelling after surgery. Do not use any lotions or creams on the incision until instructed by your surgeon.  Take Xarelto for two and a half more weeks, then discontinue Xarelto. Once the patient has completed the Xarelto, they may resume the 81 mg Aspirin.  Postoperative Constipation Protocol  Constipation - defined medically as fewer  than three stools per week and severe constipation as less than one stool per week.  One of the most common issues patients have following surgery is constipation.  Even if you have a regular bowel pattern at home, your normal regimen is likely to be disrupted due to multiple reasons following surgery.  Combination of anesthesia, postoperative narcotics, change in appetite and fluid intake all can affect your bowels.  In order to avoid complications following surgery, here are some recommendations in order to help you during your recovery period.  Colace (docusate) - Pick up an over-the-counter form of Colace or another stool softener and take twice a day as long as you are requiring postoperative pain medications.  Take with a full glass of water daily.  If you experience loose stools or diarrhea, hold the colace until you stool forms back up.  If your symptoms do not get better within 1 week or if they get worse, check with your doctor.  Dulcolax (bisacodyl) - Pick up over-the-counter and take as directed by the product packaging as needed to assist with the movement of your bowels.  Take with a full glass of water.  Use this product as needed if not relieved by Colace only.   MiraLax (polyethylene glycol) - Pick up over-the-counter to have on hand.  MiraLax is a solution that will increase the amount of water in your bowels to assist with bowel movements.  Take as directed and can mix with a glass of water, juice, soda, coffee, or tea.  Take if you go more than two days without a movement. Do not use MiraLax more than once per day. Call your doctor if you are still constipated or irregular after using this medication for 7 days in a row.  If you continue to have problems with postoperative constipation, please contact the office for further assistance and recommendations.  If you experience "the worst abdominal pain ever" or develop nausea or vomiting, please contact the office immediatly for further  recommendations for treatment.     Do not put a pillow under the knee. Place it under the heel.    Complete by:  As directed      Do not sit on low chairs, stoools or toilet seats, as it may be difficult to get up from low surfaces    Complete by:  As directed      Driving restrictions    Complete by:  As directed   No driving until released by the physician.     Increase activity slowly as tolerated    Complete by:  As directed      Lifting restrictions    Complete by:  As directed   No lifting until released by the physician.     Patient may shower    Complete by:  As directed   You may shower without a dressing once there is no drainage.  Do not wash over the wound.  If drainage remains, do not shower until drainage stops.     TED hose    Complete by:  As directed   Use stockings (TED hose) for 3 weeks on both leg(s).  You may remove them at night for sleeping.     Weight bearing as tolerated    Complete by:  As directed   Laterality:  right  Extremity:  Lower            Medication List    STOP taking these medications        aspirin EC 81 MG tablet     cholecalciferol 1000 units tablet  Commonly known as:  VITAMIN D     clindamycin 150 MG capsule  Commonly known as:  CLEOCIN     indomethacin 50 MG capsule  Commonly known as:  INDOCIN      TAKE these medications        acetaminophen 325 MG tablet  Commonly known as:  TYLENOL  Take 2 tablets (650 mg total) by mouth every 6 (six) hours as needed.     DSS 100 MG Caps  Take 100 mg by mouth 2 (two) times daily.     finasteride 5 MG tablet  Commonly known as:  PROSCAR  Take 5 mg by mouth daily.     metFORMIN 500 MG tablet  Commonly known as:  GLUCOPHAGE  Take 500 mg by mouth 2 (two) times daily with a meal.     methocarbamol 500 MG tablet  Commonly known as:  ROBAXIN  Take 1 tablet (500 mg total) by mouth every 6 (six) hours as needed for muscle spasms.     metoprolol tartrate 25 MG tablet  Commonly  known as:  LOPRESSOR  Take 25 mg by mouth 2 (two) times daily.     oxyCODONE 5 MG immediate release tablet  Commonly known as:  Oxy IR/ROXICODONE  Take 1-2 tablets (5-10 mg total) by mouth every 3 (three) hours as needed for moderate pain or severe pain.     rivaroxaban 10 MG Tabs tablet  Commonly known as:  XARELTO  Take 1 tablet (10 mg total) by mouth daily with breakfast. Take Xarelto for two and a half more weeks, then discontinue Xarelto. Once the patient has completed the Xarelto, they may resume the 81 mg Aspirin.     simvastatin 10 MG tablet  Commonly known as:  ZOCOR  Take 10 mg by mouth at bedtime.     traMADol 50 MG tablet  Commonly known as:  ULTRAM  Take 1-2 tablets (50-100 mg total) by mouth every 6 (six) hours as needed (mild pain).       Follow-up Information    Follow up with [facility].   Why:  home health physical therapy   Contact information:   [address] [place] [address] [phone]       Follow up with [facility].   Why:  rolling walker   Contact information:   [address] [place] [address] [phone]       Follow up with Gearlean Alf, MD. Schedule an appointment as soon as possible for a visit on 07/29/2015.   Specialty:  Orthopedic Surgery   Why:  Call office at [phone] to setup appoitment on Tuesday 07/29/2015 with Dr. Wynelle Link.   Contact information:   [address] [place] [address] [phone]       Signed: Arlee Muslim, PA-C Orthopaedic Surgery 07/30/2015, 8:59 PM

## 2015-07-15 NOTE — Progress Notes (Signed)
Physical Therapy Treatment Patient Details Name: James Hendrix MRN: [id number] DOB: Jun 07, 1933 Today's Date: 07/15/2015    History of Present Illness s/p R TKA;  PMHx: L TKA    PT Comments    POD # 1 am session Assisted OOB to amb a greater distance in [place] then returned to room to perform TKR TE's following HEP handout followed by ICE.   Follow Up Recommendations  Home health PT     Equipment Recommendations  None recommended by PT    Recommendations for Other Services       Precautions / Restrictions Precautions Precautions: Knee Restrictions Weight Bearing Restrictions: No Other Position/Activity Restrictions: WBAT    Mobility  Bed Mobility Overal bed mobility: Needs Assistance Bed Mobility: Supine to Sit     Supine to sit: Min assist     General bed mobility comments: assist with RLE plus increased time  Transfers Overall transfer level: Needs assistance Equipment used: Rolling walker (2 wheeled) Transfers: Sit to/from Stand Sit to Stand: Supervision         General transfer comment: cues for hand placement and RLE position  Ambulation/Gait Ambulation/Gait assistance: Min guard;Min assist Ambulation Distance (Feet): 55 Feet Assistive device: Rolling walker (2 wheeled) Gait Pattern/deviations: Step-to pattern;Decreased stance time - right Gait velocity: decreased   General Gait Details: cues for sequence,  RW position   Stairs            Wheelchair Mobility    Modified Rankin (Stroke Patients Only)       Balance                                    Cognition Arousal/Alertness: Awake/alert Behavior During Therapy: WFL for tasks assessed/performed Overall Cognitive Status: Within Functional Limits for tasks assessed                      Exercises   Total Knee Replacement TE's 10 reps B LE ankle pumps 10 reps towel squeezes 10 reps knee presses 10 reps heel slides  10 reps SAQ's 10 reps SLR's 10  reps ABD Followed by ICE     General Comments        Pertinent Vitals/Pain Pain Assessment: 0-10 Pain Score: 4  Pain Location: R knee Pain Descriptors / Indicators: Aching;Sore Pain Intervention(s): Premedicated before session;Repositioned;Ice applied;Monitored during session    Home Living Family/patient expects to be discharged to:: Private residence Living Arrangements: Spouse/significant other;Children Available Help at Discharge: Family Type of Home: House Home Access: Stairs to enter Entrance Stairs-Rails: None Home Layout: Two level;Bed/bath upstairs;1/2 bath on main level Home Equipment: Cane - single point;Crutches;Walker - 2 wheels      Prior Function Level of Independence: Independent          PT Goals (current goals can now be found in the care plan section) Acute Rehab PT Goals Patient Stated Goal: home Progress towards PT goals: Progressing toward goals    Frequency  7X/week    PT Plan Current plan remains appropriate    Co-evaluation             End of Session Equipment Utilized During Treatment: Gait belt Activity Tolerance: Patient tolerated treatment well Patient left: in chair;with call bell/phone within reach;with chair alarm set;with family/visitor present     Time: 0850-0920 PT Time Calculation (min) (ACUTE ONLY): 30 min  Charges:  $Gait Training: 8-22 mins $Therapeutic Exercise: 8-22  mins                    G Codes:      Rica Koyanagi  PTA [facility]  Acute  Rehab Pager      [phone]

## 2015-07-15 NOTE — Progress Notes (Signed)
   Subjective: 1 Day Post-Op Procedure(s) (LRB): TOTAL KNEE ARTHROPLASTY (Right) Patient reports no pain this morning on rounds. Patient seen in rounds with Dr. Lequita Halt.  Doing okay this morning. Patient is well, and has had no acute complaints or problems We will resume therapy today. He got up OOB and took a few steps yesterday. Plan is to go Home after hospital stay.  Objective: Vital signs in last 24 hours: Temp:  [97.2 F (36.2 C)-97.9 F (36.6 C)] 97.9 F (36.6 C) (02/28 0542) Pulse Rate:  [53-75] 63 (02/28 0542) Resp:  [8-20] 18 (02/28 0542) BP: (121-141)/(59-79) 121/60 mmHg (02/28 0542) SpO2:  [91 %-99 %] 99 % (02/28 0542) Weight:  [85.276 kg (188 lb)] 85.276 kg (188 lb) (02/27 1003)  Intake/Output from previous day:  Intake/Output Summary (Last 24 hours) at 07/15/15 0859 Last data filed at 07/15/15 0543  Gross per 24 hour  Intake   3105 ml  Output   2575 ml  Net    530 ml    Intake/Output this shift: UOP 1400 since around MN  Labs:  Recent Labs  07/15/15 0343  HGB 12.7*    Recent Labs  07/15/15 0343  WBC 12.3*  RBC 4.02*  HCT 36.5*  PLT 107*    Recent Labs  07/15/15 0343  NA 136  K 4.6  CL 104  CO2 23  BUN 20  CREATININE 1.14  GLUCOSE 190*  CALCIUM 8.2*   No results for input(s): LABPT, INR in the last 72 hours.  EXAM General - Patient is Alert, Appropriate and Oriented Extremity - Neurovascular intact Sensation intact distally Dorsiflexion/Plantar flexion intact Dressing - dressing C/D/I Motor Function - intact, moving foot and toes well on exam.  Hemovac pulled without difficulty.  Past Medical History  Diagnosis Date  . Hypertension   . Chronic kidney disease     ckr stage 3  . Diabetes mellitus without complication (HCC)     diet  . Gout   . Hyperlipidemia   . Arthritis   . Congenital absence of one kidney   . History of kidney stones     Assessment/Plan: 1 Day Post-Op Procedure(s) (LRB): TOTAL KNEE ARTHROPLASTY  (Right) Principal Problem:   OA (osteoarthritis) of knee  Estimated body mass index is 28.59 kg/(m^2) as calculated from the following:   Height as of this encounter:  (1.727 m).   Weight as of this encounter: 85.276 kg (188 lb). Advance diet Up with therapy Plan for discharge tomorrow Discharge home with home health  DVT Prophylaxis - Xarelto Weight-Bearing as tolerated to right leg D/C O2 and Pulse OX and try on Room Air HGB 12.7 on POD 1 following surgery.  Avel Peace, PA-C Orthopaedic Surgery 07/15/2015, 8:59 AM

## 2015-07-15 NOTE — Progress Notes (Signed)
Occupational Therapy Evaluation Patient Details Name: James Hendrix MRN: [id number] DOB: 09/20/1933 Today's Date: 07/15/2015    History of Present Illness s/p R TKA;  PMHx: L TKA   Clinical Impression   All OT education completed and patient has no further OT needs at this time. Will sign off.    Follow Up Recommendations  No OT follow up;Supervision - Intermittent    Equipment Recommendations  None recommended by OT    Recommendations for Other Services       Precautions / Restrictions Precautions Precautions: Knee Restrictions Weight Bearing Restrictions: No Other Position/Activity Restrictions: WBAT      Mobility Bed Mobility                  Transfers Overall transfer level: Needs assistance Equipment used: Rolling walker (2 wheeled) Transfers: Sit to/from Stand Sit to Stand: Supervision              Balance                                            ADL Overall ADL's : Needs assistance/impaired Eating/Feeding: Independent;Sitting   Grooming: Supervision/safety;Standing   Upper Body Bathing: Supervision/ safety;Standing   Lower Body Bathing: Supervison/ safety;Sit to/from stand   Upper Body Dressing : Sitting;Set up   Lower Body Dressing: Supervision/safety;Sit to/from stand   Toilet Transfer: Supervision/safety;Ambulation;Regular Toilet;RW   Toileting- Clothing Manipulation and Hygiene: Supervision/safety;Sit to/from stand   Tub/ Shower Transfer: Walk-in shower;Supervision/safety;Ambulation;Rolling walker   Functional mobility during ADLs: Supervision/safety;Rolling walker General ADL Comments: Patient received standing at doorway asking for assistance getting his clothes so he could get dressed. Assisted pt with finishing his bath, getting dressed, toilet/shower transfers. Daughter's home has walk-in shower he can use. All OT education completed.      Vision     Perception     Praxis      Pertinent  Vitals/Pain Pain Assessment: 0-10 Pain Score: 3  Pain Location: R knee Pain Descriptors / Indicators: Sore Pain Intervention(s): Limited activity within patient's tolerance;Monitored during session;Repositioned     Hand Dominance Right   Extremity/Trunk Assessment Upper Extremity Assessment Upper Extremity Assessment: Overall WFL for tasks assessed   Lower Extremity Assessment Lower Extremity Assessment: Defer to PT evaluation   Cervical / Trunk Assessment Cervical / Trunk Assessment: Normal   Communication Communication Communication: No difficulties   Cognition Arousal/Alertness: Awake/alert Behavior During Therapy: WFL for tasks assessed/performed Overall Cognitive Status: Within Functional Limits for tasks assessed                     General Comments       Exercises       Shoulder Instructions      Home Living Family/patient expects to be discharged to:: Private residence Living Arrangements: Spouse/significant other;Children Available Help at Discharge: Family Type of Home: House Home Access: Stairs to enter Secretary/administrator of Steps: 1 Entrance Stairs-Rails: None Home Layout: Two level;Bed/bath upstairs;1/2 bath on main level Alternate Level Stairs-Number of Steps: flight    Bathroom Shower/Tub: Walk-in shower Shower/tub characteristics: Engineer, building services: Standard Bathroom Accessibility: Yes How Accessible: Accessible via walker Home Equipment: Cane - single point;Crutches;Walker - 2 wheels          Prior Functioning/Environment Level of Independence: Independent             OT Diagnosis: Acute pain  OT Problem List: Decreased range of motion;Decreased activity tolerance;Decreased knowledge of use of DME or AE;Pain   OT Treatment/Interventions:      OT Goals(Current goals can be found in the care plan section) Acute Rehab OT Goals Patient Stated Goal: home OT Goal Formulation: All assessment and education complete,  DC therapy  OT Frequency:     Barriers to D/C:            Co-evaluation              End of Session Equipment Utilized During Treatment: Rolling walker Nurse Communication: Mobility status  Activity Tolerance: Patient tolerated treatment well Patient left: in chair;with call bell/phone within reach   Time: 1101-1114 OT Time Calculation (min): 13 min Charges:  OT General Charges $OT Visit: 1 Procedure OT Evaluation $OT Eval Low Complexity: 1 Procedure G-Codes:    Deshana Rominger A 07-23-2015, 1:17 PM

## 2015-07-15 NOTE — Care Management Note (Signed)
Case Management Note  Patient Details  Name: James Hendrix MRN: [id number] Date of Birth: [birth year]  Subjective/Objective:                  TOTAL KNEE ARTHROPLASTY  Action/Plan: Discharge planning Expected Discharge Date:  07/15/15              Expected Discharge Plan:  [place]  In-House Referral:     Discharge planning Services  CM Consult  Post Acute Care Choice:    Choice offered to:  Patient, Adult Children  DME Arranged:  Walker rolling DME Agency:  [place]:  PT [facility] Agency:  [place]  Status of Service:  Completed, signed off  Medicare Important Message Given:    Date Medicare IM Given:    Medicare IM give by:    Date Additional Medicare IM Given:    Additional Medicare Important Message give by:     If discussed at [place] of Stay Meetings, dates discussed:    Additional Comments: CM met with pt in room to offer choice of home health agency.  Pt chooses [facility] to render HHPT.  Pt will be going home to [place] [phone].  Referral called to [facility] rep, with appropriate address.  Cm called [facility] DME rep, Lecretia to please deliver the rollling walker to room prior to discharge.  No other CM needs were communicated. Dellie Catholic, RN 07/15/2015, 11:26 AM

## 2015-07-15 NOTE — Discharge Instructions (Addendum)
° °Dr. Frank Aluisio °Total Joint Specialist °[facility] °[address] °[place], [place] [address] °[phone] ° °TOTAL KNEE REPLACEMENT POSTOPERATIVE DIRECTIONS ° °Knee Rehabilitation, Guidelines Following Surgery  °Results after knee surgery are often greatly improved when you follow the exercise, range of motion and muscle strengthening exercises prescribed by your doctor. Safety measures are also important to protect the knee from further injury. Any time any of these exercises cause you to have increased pain or swelling in your knee joint, decrease the amount until you are comfortable again and slowly increase them. If you have problems or questions, call your caregiver or physical therapist for advice.  ° °HOME CARE INSTRUCTIONS  °Remove items at home which could result in a fall. This includes throw rugs or furniture in walking pathways.  °· ICE to the affected knee every three hours for 30 minutes at a time and then as needed for pain and swelling.  Continue to use ice on the knee for pain and swelling from surgery. You may notice swelling that will progress down to the foot and ankle.  This is normal after surgery.  Elevate the leg when you are not up walking on it.   °· Continue to use the breathing machine which will help keep your temperature down.  It is common for your temperature to cycle up and down following surgery, especially at night when you are not up moving around and exerting yourself.  The breathing machine keeps your lungs expanded and your temperature down. °· Do not place pillow under knee, focus on keeping the knee straight while resting ° °DIET °You may resume your previous home diet once your are discharged from the hospital. ° °DRESSING / WOUND CARE / SHOWERING °You may shower 3 days after surgery, but keep the wounds dry during showering.  You may use an occlusive plastic wrap (Press'n Seal for example), NO SOAKING/SUBMERGING IN THE BATHTUB.  If the  bandage gets wet, change with a clean dry gauze.  If the incision gets wet, pat the wound dry with a clean towel. °You may start showering once you are discharged home but do not submerge the incision under water. Just pat the incision dry and apply a dry gauze dressing on daily. °Change the surgical dressing daily and reapply a dry dressing each time. ° °ACTIVITY °Walk with your walker as instructed. °Use walker as long as suggested by your caregivers. °Avoid periods of inactivity such as sitting longer than an hour when not asleep. This helps prevent blood clots.  °You may resume a sexual relationship in one month or when given the OK by your doctor.  °You may return to work once you are cleared by your doctor.  °Do not drive a car for 6 weeks or until released by you surgeon.  °Do not drive while taking narcotics. ° °WEIGHT BEARING °Weight bearing as tolerated with assist device (walker, cane, etc) as directed, use it as long as suggested by your surgeon or therapist, typically at least 4-6 weeks. ° °POSTOPERATIVE CONSTIPATION PROTOCOL °Constipation - defined medically as fewer than three stools per week and severe constipation as less than one stool per week. ° °One of the most common issues patients have following surgery is constipation.  Even if you have a regular bowel pattern at home, your normal regimen is likely to be disrupted due to multiple reasons following surgery.  Combination of anesthesia, postoperative narcotics, change in appetite and fluid intake all can affect your bowels.    In order to avoid complications following surgery, here are some recommendations in order to help you during your recovery period. ° °Colace (docusate) - Pick up an over-the-counter form of Colace or another stool softener and take twice a day as long as you are requiring postoperative pain medications.  Take with a full glass of water daily.  If you experience loose stools or diarrhea, hold the colace until you stool forms  back up.  If your symptoms do not get better within 1 week or if they get worse, check with your doctor. ° °Dulcolax (bisacodyl) - Pick up over-the-counter and take as directed by the product packaging as needed to assist with the movement of your bowels.  Take with a full glass of water.  Use this product as needed if not relieved by Colace only.  ° °MiraLax (polyethylene glycol) - Pick up over-the-counter to have on hand.  MiraLax is a solution that will increase the amount of water in your bowels to assist with bowel movements.  Take as directed and can mix with a glass of water, juice, soda, coffee, or tea.  Take if you go more than two days without a movement. °Do not use MiraLax more than once per day. Call your doctor if you are still constipated or irregular after using this medication for 7 days in a row. ° °If you continue to have problems with postoperative constipation, please contact the office for further assistance and recommendations.  If you experience "the worst abdominal pain ever" or develop nausea or vomiting, please contact the office immediatly for further recommendations for treatment. ° °ITCHING ° If you experience itching with your medications, try taking only a single pain pill, or even half a pain pill at a time.  You can also use Benadryl over the counter for itching or also to help with sleep.  ° °TED HOSE STOCKINGS °Wear the elastic stockings on both legs for three weeks following surgery during the day but you may remove then at night for sleeping. ° °MEDICATIONS °See your medication summary on the “After Visit Summary” that the nursing staff will review with you prior to discharge.  You may have some home medications which will be placed on hold until you complete the course of blood thinner medication.  It is important for you to complete the blood thinner medication as prescribed by your surgeon.  Continue your approved medications as instructed at time of  discharge. ° °PRECAUTIONS °If you experience chest pain or shortness of breath - call 911 immediately for transfer to the hospital emergency department.  °If you develop a fever greater that 101 F, purulent drainage from wound, increased redness or drainage from wound, foul odor from the wound/dressing, or calf pain - CONTACT YOUR SURGEON.   °                                                °FOLLOW-UP APPOINTMENTS °Make sure you keep all of your appointments after your operation with your surgeon and caregivers. You should call the office at the above phone number and make an appointment for approximately two weeks after the date of your surgery or on the date instructed by your surgeon outlined in the "After Visit Summary". ° ° °RANGE OF MOTION AND STRENGTHENING EXERCISES  °Rehabilitation of the knee is important following a knee injury or   an operation. After just a few days of immobilization, the muscles of the thigh which control the knee become weakened and shrink (atrophy). Knee exercises are designed to build up the tone and strength of the thigh muscles and to improve knee motion. Often times heat used for twenty to thirty minutes before working out will loosen up your tissues and help with improving the range of motion but do not use heat for the first two weeks following surgery. These exercises can be done on a training (exercise) mat, on the floor, on a table or on a bed. Use what ever works the best and is most comfortable for you Knee exercises include:  °Leg Lifts - While your knee is still immobilized in a splint or cast, you can do straight leg raises. Lift the leg to 60 degrees, hold for 3 sec, and slowly lower the leg. Repeat 10-20 times 2-3 times daily. Perform this exercise against resistance later as your knee gets better.  °Quad and Hamstring Sets - Tighten up the muscle on the front of the thigh (Quad) and hold for 5-10 sec. Repeat this 10-20 times hourly. Hamstring sets are done by pushing the  foot backward against an object and holding for 5-10 sec. Repeat as with quad sets.  °· Leg Slides: Lying on your back, slowly slide your foot toward your buttocks, bending your knee up off the floor (only go as far as is comfortable). Then slowly slide your foot back down until your leg is flat on the floor again. °· Angel Wings: Lying on your back spread your legs to the side as far apart as you can without causing discomfort.  °A rehabilitation program following serious knee injuries can speed recovery and prevent re-injury in the future due to weakened muscles. Contact your doctor or a physical therapist for more information on knee rehabilitation.  ° °IF YOU ARE TRANSFERRED TO A SKILLED REHAB FACILITY °If the patient is transferred to a skilled rehab facility following release from the hospital, a list of the current medications will be sent to the facility for the patient to continue.  When discharged from the skilled rehab facility, please have the facility set up the patient's Home Health Physical Therapy prior to being released. Also, the skilled facility will be responsible for providing the patient with their medications at time of release from the facility to include their pain medication, the muscle relaxants, and their blood thinner medication. If the patient is still at the rehab facility at time of the two week follow up appointment, the skilled rehab facility will also need to assist the patient in arranging follow up appointment in our office and any transportation needs. ° °MAKE SURE YOU:  °Understand these instructions.  °Get help right away if you are not doing well or get worse.  ° ° °Pick up stool softner and laxative for home use following surgery while on pain medications. °Do not submerge incision under water. °Please use good hand washing techniques while changing dressing each day. °May shower starting three days after surgery. °Please use a clean towel to pat the incision dry following  showers. °Continue to use ice for pain and swelling after surgery. °Do not use any lotions or creams on the incision until instructed by your surgeon. ° °Take Xarelto for two and a half more weeks, then discontinue Xarelto. °Once the patient has completed the Xarelto, they may resume the 81 mg Aspirin. ° ° °Information on my medicine - XARELTO® (Rivaroxaban) ° °  This medication education was reviewed with me or my healthcare representative as part of my discharge preparation.  The pharmacist that spoke with me during my hospital stay was:  Runyon, Amanda, RPH ° °Why was Xarelto® prescribed for you? °Xarelto® was prescribed for you to reduce the risk of blood clots forming after orthopedic surgery. The medical term for these abnormal blood clots is venous thromboembolism (VTE). ° °What do you need to know about xarelto® ? °Take your Xarelto® ONCE DAILY at the same time every day. °You may take it either with or without food. ° °If you have difficulty swallowing the tablet whole, you may crush it and mix in applesauce just prior to taking your dose. ° °Take Xarelto® exactly as prescribed by your doctor and DO NOT stop taking Xarelto® without talking to the doctor who prescribed the medication.  Stopping without other VTE prevention medication to take the place of Xarelto® may increase your risk of developing a clot. ° °After discharge, you should have regular check-up appointments with your healthcare provider that is prescribing your Xarelto®.   ° °What do you do if you miss a dose? °If you miss a dose, take it as soon as you remember on the same day then continue your regularly scheduled once daily regimen the next day. Do not take two doses of Xarelto® on the same day.  ° °Important Safety Information °A possible side effect of Xarelto® is bleeding. You should call your healthcare provider right away if you experience any of the following: °? Bleeding from an injury or your nose that does not stop. °? Unusual  colored urine (red or dark brown) or unusual colored stools (red or black). °? Unusual bruising for unknown reasons. °? A serious fall or if you hit your head (even if there is no bleeding). ° °Some medicines may interact with Xarelto® and might increase your risk of bleeding while on Xarelto®. To help avoid this, consult your healthcare provider or pharmacist prior to using any new prescription or non-prescription medications, including herbals, vitamins, non-steroidal anti-inflammatory drugs (NSAIDs) and supplements. ° °This website has more information on Xarelto®: www.xarelto.com. ° ° ° °

## 2015-07-15 NOTE — Progress Notes (Signed)
Physical Therapy Treatment Patient Details Name: James Hendrix MRN: [id number] DOB: [birth year] Today's Date: 07/15/2015    History of Present Illness s/p R TKA;  PMHx: L TKA    PT Comments    POD # 1 pm session. Assisted with amb a greater distance in hallway then back to bed for CPM.  Pt progressing well and plans to D/C to his daughters home tomorrow.  [place], [place].  Follow Up Recommendations  Home health PT     Equipment Recommendations  None recommended by PT    Recommendations for Other Services       Precautions / Restrictions Precautions Precautions: Knee Restrictions Weight Bearing Restrictions: No Other Position/Activity Restrictions: WBAT    Mobility  Bed Mobility Overal bed mobility: Needs Assistance Bed Mobility: Sit to Supine     Supine to sit: Min assist Sit to supine: Min guard   General bed mobility comments: assisted back to bed for CPM  Transfers Overall transfer level: Needs assistance Equipment used: Rolling walker (2 wheeled) Transfers: Sit to/from Stand Sit to Stand: Supervision         General transfer comment: cues for hand placement and RLE position  Ambulation/Gait Ambulation/Gait assistance: Min guard;Min assist Ambulation Distance (Feet): 85 Feet Assistive device: Rolling walker (2 wheeled) Gait Pattern/deviations: Step-to pattern;Decreased stance time - right Gait velocity: decreased   General Gait Details: cues for sequence,  RW position   Stairs            Wheelchair Mobility    Modified Rankin (Stroke Patients Only)       Balance                                    Cognition Arousal/Alertness: Awake/alert Behavior During Therapy: WFL for tasks assessed/performed Overall Cognitive Status: Within Functional Limits for tasks assessed                      Exercises      General Comments        Pertinent Vitals/Pain Pain Assessment: 0-10 Pain Score: 4  Pain Location: R  knee Pain Descriptors / Indicators: Aching;Sore Pain Intervention(s): Premedicated before session;Repositioned;Ice applied;Monitored during session    Home Living Family/patient expects to be discharged to:: Private residence Living Arrangements: Spouse/significant other;Children Available Help at Discharge: Family Type of Home: House Home Access: Stairs to enter Entrance Stairs-Rails: None Home Layout: Two level;Bed/bath upstairs;1/2 bath on main level Home Equipment: Cane - single point;Crutches;Walker - 2 wheels      Prior Function Level of Independence: Independent          PT Goals (current goals can now be found in the care plan section) Acute Rehab PT Goals Patient Stated Goal: home Progress towards PT goals: Progressing toward goals    Frequency  7X/week    PT Plan Current plan remains appropriate    Co-evaluation             End of Session Equipment Utilized During Treatment: Gait belt Activity Tolerance: Patient tolerated treatment well Patient left: in chair;with call bell/phone within reach;with chair alarm set;with family/visitor present     Time: 1325-1340 PT Time Calculation (min) (ACUTE ONLY): 15 min  Charges:  $Gait Training: 8-22 mins                    G Codes:      Felecia Shelling  PTA  [facility]  Acute  Rehab Pager      [phone]

## 2015-07-16 LAB — CBC
HCT: 35.9 % — ABNORMAL LOW (ref 39.0–52.0)
Hemoglobin: 12.2 g/dL — ABNORMAL LOW (ref 13.0–17.0)
MCH: 31.4 pg (ref 26.0–34.0)
MCHC: 34 g/dL (ref 30.0–36.0)
MCV: 92.5 fL (ref 78.0–100.0)
PLATELETS: 110 10*3/uL — AB (ref 150–400)
RBC: 3.88 MIL/uL — AB (ref 4.22–5.81)
RDW: 13.7 % (ref 11.5–15.5)
WBC: 10.2 10*3/uL (ref 4.0–10.5)

## 2015-07-16 LAB — GLUCOSE, CAPILLARY
Glucose-Capillary: 161 mg/dL — ABNORMAL HIGH (ref 65–99)
Glucose-Capillary: 123 mg/dL — ABNORMAL HIGH (ref 65–99)
Glucose-Capillary: 151 mg/dL — ABNORMAL HIGH (ref 65–99)

## 2015-07-16 LAB — BASIC METABOLIC PANEL
ANION GAP: 6 (ref 5–15)
BUN: 22 mg/dL — ABNORMAL HIGH (ref 6–20)
CALCIUM: 8.3 mg/dL — AB (ref 8.9–10.3)
CO2: 27 mmol/L (ref 22–32)
Chloride: 104 mmol/L (ref 101–111)
Creatinine, Ser: 1.24 mg/dL (ref 0.61–1.24)
GFR, EST NON AFRICAN AMERICAN: 54 mL/min — AB (ref >60)
GLUCOSE: 142 mg/dL — AB (ref 65–99)
Potassium: 4.6 mmol/L (ref 3.5–5.1)
SODIUM: 137 mmol/L (ref 135–145)

## 2015-07-16 MED ORDER — OXYCODONE HCL 5 MG PO TABS: 5.0000 mg | ORAL_TABLET | ORAL | Status: AC | PRN

## 2015-07-16 MED ORDER — RIVAROXABAN 10 MG PO TABS: 10.0000 mg | ORAL_TABLET | Freq: Every day | ORAL | Status: AC

## 2015-07-16 MED ORDER — TRAMADOL HCL 50 MG PO TABS: 50.0000 mg | ORAL_TABLET | Freq: Four times a day (QID) | ORAL | Status: AC | PRN

## 2015-07-16 MED ORDER — METHOCARBAMOL 500 MG PO TABS: 500.0000 mg | ORAL_TABLET | Freq: Four times a day (QID) | ORAL | Status: AC | PRN

## 2015-07-16 NOTE — Progress Notes (Signed)
   Subjective: 2 Days Post-Op Procedure(s) (LRB): TOTAL KNEE ARTHROPLASTY (Right) Patient reports pain as mild.   Patient seen in rounds with Dr. Lequita Halt. Patient is well, and has had no acute complaints or problems Patient is ready to go home  Objective: Vital signs in last 24 hours: Temp:  [97.8 F (36.6 C)-98.1 F (36.7 C)] 97.8 F (36.6 C) (03/01 0611) Pulse Rate:  [59-72] 59 (03/01 0611) Resp:  [16-18] 16 (03/01 0611) BP: (117-166)/(52-68) 166/57 mmHg (03/01 0611) SpO2:  [96 %-99 %] 99 % (03/01 0611)  Intake/Output from previous day:  Intake/Output Summary (Last 24 hours) at 07/16/15 0727 Last data filed at 07/16/15 0612  Gross per 24 hour  Intake   1560 ml  Output   2750 ml  Net  -1190 ml     Labs:  Recent Labs  07/15/15 0343 07/16/15 0349  HGB 12.7* 12.2*    Recent Labs  07/15/15 0343 07/16/15 0349  WBC 12.3* 10.2  RBC 4.02* 3.88*  HCT 36.5* 35.9*  PLT 107* 110*    Recent Labs  07/15/15 0343 07/16/15 0349  NA 136 137  K 4.6 4.6  CL 104 104  CO2 23 27  BUN 20 22*  CREATININE 1.14 1.24  GLUCOSE 190* 142*  CALCIUM 8.2* 8.3*   No results for input(s): LABPT, INR in the last 72 hours.  EXAM: General - Patient is Alert and Appropriate Extremity - Neurovascular intact Sensation intact distally Incision - clean, dry, no drainage Motor Function - intact, moving foot and toes well on exam.   Assessment/Plan: 2 Days Post-Op Procedure(s) (LRB): TOTAL KNEE ARTHROPLASTY (Right) Procedure(s) (LRB): TOTAL KNEE ARTHROPLASTY (Right) Past Medical History  Diagnosis Date  . Hypertension   . Chronic kidney disease     ckr stage 3  . Diabetes mellitus without complication (HCC)     diet  . Gout   . Hyperlipidemia   . Arthritis   . Congenital absence of one kidney   . History of kidney stones    Principal Problem:   OA (osteoarthritis) of knee  Estimated body mass index is 28.59 kg/(m^2) as calculated from the following:   Height as of this  encounter:  (1.727 m).   Weight as of this encounter: 85.276 kg (188 lb). Up with therapy Discharge home with home health Diet - Cardiac diet, Diabetic diet and Renal diet Follow up - in 2 weeks Activity - WBAT Disposition - Home Condition Upon Discharge - Good D/C Meds - See DC Summary DVT Prophylaxis - Xarelto  Avel Peace, PA-C Orthopaedic Surgery 07/16/2015, 7:27 AM

## 2015-07-16 NOTE — Progress Notes (Signed)
Physical Therapy Treatment Patient Details Name: James Hendrix MRN: [id number] DOB: 07/25/1933 Today's Date: 07/16/2015    History of Present Illness s/p R TKA;  PMHx: L TKA    PT Comments    POD # 2 Assisted with amb in hallway, practiced one step twice then performed all supine TKR TE's followed by ice.   Follow Up Recommendations  Home health PT     Equipment Recommendations  None recommended by PT    Recommendations for Other Services       Precautions / Restrictions Precautions Precautions: Knee Restrictions Weight Bearing Restrictions: No Other Position/Activity Restrictions: WBAT    Mobility  Bed Mobility Overal bed mobility: Needs Assistance Bed Mobility: Sit to Supine       Sit to supine: Min guard   General bed mobility comments: assisted back to bed per pt request  Transfers Overall transfer level: Needs assistance Equipment used: Rolling walker (2 wheeled) Transfers: Sit to/from Stand Sit to Stand: Supervision         General transfer comment: cues for hand placement and RLE position  Ambulation/Gait Ambulation/Gait assistance: Supervision;Min guard Ambulation Distance (Feet): 78 Feet Assistive device: Rolling walker (2 wheeled) Gait Pattern/deviations: Step-to pattern Gait velocity: decreased   General Gait Details: cues for sequence,  RW position   Stairs Stairs: Yes Stairs assistance: Min guard Stair Management: No rails;Step to pattern;Forwards;With walker Number of Stairs: 1 General stair comments: performed twice one step forward with walker  Wheelchair Mobility    Modified Rankin (Stroke Patients Only)       Balance                                    Cognition                            Exercises   Total Knee Replacement TE's 10 reps B LE ankle pumps 10 reps towel squeezes 10 reps knee presses 10 reps heel slides  10 reps SAQ's 10 reps SLR's 10 reps ABD Followed by ICE      General Comments        Pertinent Vitals/Pain      Home Living                      Prior Function            PT Goals (current goals can now be found in the care plan section) Progress towards PT goals: Progressing toward goals    Frequency  7X/week    PT Plan Current plan remains appropriate    Co-evaluation             End of Session Equipment Utilized During Treatment: Gait belt Activity Tolerance: Patient tolerated treatment well Patient left: with call bell/phone within reach;with chair alarm set;with family/visitor present;in bed     Time: 1335-1400 PT Time Calculation (min) (ACUTE ONLY): 25 min  Charges:  $Gait Training: 8-22 mins $Therapeutic Activity: 8-22 mins                    G Codes:      Felecia Shelling  PTA [facility]  Acute  Rehab Pager      [phone]

## 2015-07-16 NOTE — Progress Notes (Signed)
Discharge instructions reviewed with patient, wife and daughter.

## 2018-12-16 DEATH — deceased
